# Patient Record
Sex: Male | Born: 1960 | Race: Black or African American | Hispanic: No | Marital: Married | Smoking: Never smoker
Health system: Southern US, Community
[De-identification: ages and names within clinical notes are randomized; demographics above are authoritative.]

## PROBLEM LIST (undated history)

## (undated) DIAGNOSIS — M1990 Unspecified osteoarthritis, unspecified site: Secondary | ICD-10-CM

## (undated) DIAGNOSIS — Z87442 Personal history of urinary calculi: Secondary | ICD-10-CM

## (undated) DIAGNOSIS — Z9189 Other specified personal risk factors, not elsewhere classified: Secondary | ICD-10-CM

## (undated) DIAGNOSIS — G43909 Migraine, unspecified, not intractable, without status migrainosus: Secondary | ICD-10-CM

## (undated) DIAGNOSIS — M545 Low back pain: Secondary | ICD-10-CM

## (undated) HISTORY — DX: Unspecified osteoarthritis, unspecified site: M19.90

## (undated) HISTORY — DX: Low back pain: M54.5

## (undated) HISTORY — DX: Migraine, unspecified, not intractable, without status migrainosus: G43.909

## (undated) HISTORY — DX: Personal history of urinary calculi: Z87.442

## (undated) HISTORY — DX: Other specified personal risk factors, not elsewhere classified: Z91.89

---

## 1998-05-30 DIAGNOSIS — Z87442 Personal history of urinary calculi: Secondary | ICD-10-CM

## 1998-05-30 HISTORY — DX: Personal history of urinary calculi: Z87.442

## 1998-08-16 ENCOUNTER — Emergency Department (HOSPITAL_COMMUNITY): Admission: EM | Admit: 1998-08-16 | Discharge: 1998-08-16 | Payer: Self-pay | Admitting: Emergency Medicine

## 2001-10-23 ENCOUNTER — Emergency Department (HOSPITAL_COMMUNITY): Admission: EM | Admit: 2001-10-23 | Discharge: 2001-10-23 | Payer: Self-pay | Admitting: Emergency Medicine

## 2003-08-27 ENCOUNTER — Encounter: Admission: RE | Admit: 2003-08-27 | Discharge: 2003-08-27 | Payer: Self-pay | Admitting: Family Medicine

## 2006-07-17 ENCOUNTER — Emergency Department (HOSPITAL_COMMUNITY): Admission: EM | Admit: 2006-07-17 | Discharge: 2006-07-17 | Payer: Self-pay | Admitting: Emergency Medicine

## 2006-07-31 ENCOUNTER — Encounter: Admission: RE | Admit: 2006-07-31 | Discharge: 2006-07-31 | Payer: Self-pay | Admitting: Internal Medicine

## 2006-08-08 ENCOUNTER — Encounter: Payer: Self-pay | Admitting: Internal Medicine

## 2006-08-10 ENCOUNTER — Encounter: Payer: Self-pay | Admitting: Internal Medicine

## 2006-08-14 ENCOUNTER — Encounter: Payer: Self-pay | Admitting: Internal Medicine

## 2006-08-14 LAB — CONVERTED CEMR LAB
ALT: 12 units/L
AST: 16 units/L
BUN: 17 mg/dL
Basophils Relative: 0 %
Chloride: 103 meq/L
Creatinine, Ser: 1.14 mg/dL
Glucose, Bld: 88 mg/dL
HDL: 67 mg/dL
Hgb A1c MFr Bld: 5.4 %
Lymphocytes, automated: 39 %
RDW: 14.6 %
Total Bilirubin: 0.5 mg/dL

## 2006-08-24 ENCOUNTER — Encounter: Admission: RE | Admit: 2006-08-24 | Discharge: 2006-08-24 | Payer: Self-pay | Admitting: Orthopedic Surgery

## 2006-09-07 ENCOUNTER — Encounter: Admission: RE | Admit: 2006-09-07 | Discharge: 2006-09-07 | Payer: Self-pay | Admitting: Orthopedic Surgery

## 2007-03-20 ENCOUNTER — Encounter: Admission: RE | Admit: 2007-03-20 | Discharge: 2007-03-20 | Payer: Self-pay | Admitting: Orthopedic Surgery

## 2007-05-31 DIAGNOSIS — M545 Low back pain, unspecified: Secondary | ICD-10-CM

## 2007-05-31 HISTORY — DX: Low back pain, unspecified: M54.50

## 2007-08-14 ENCOUNTER — Encounter: Payer: Self-pay | Admitting: Internal Medicine

## 2007-08-14 LAB — CONVERTED CEMR LAB
ALT: 22 units/L
AST: 15 units/L
Basophils Relative: 0 %
CO2: 26 meq/L
Chloride: 102 meq/L
HCT: 44.5 %
Hemoglobin: 14 g/dL
Hgb A1c MFr Bld: 5.8 %
Monocytes Relative: 10 %
Potassium: 4.2 meq/L
Total Bilirubin: 0.4 mg/dL
Triglyceride fasting, serum: 579 mg/dL

## 2007-09-20 ENCOUNTER — Encounter: Payer: Self-pay | Admitting: Internal Medicine

## 2007-09-20 LAB — CONVERTED CEMR LAB
HDL: 56 mg/dL
LDL Cholesterol: 131 mg/dL
Triglyceride fasting, serum: 164 mg/dL

## 2009-12-02 ENCOUNTER — Ambulatory Visit: Payer: Self-pay | Admitting: Internal Medicine

## 2009-12-02 LAB — CONVERTED CEMR LAB
AST: 23 units/L (ref 0–37)
Bilirubin, Direct: 0.1 mg/dL (ref 0.0–0.3)
CO2: 26 meq/L (ref 19–32)
Cholesterol: 244 mg/dL — ABNORMAL HIGH (ref 0–200)
Creatinine, Ser: 1.1 mg/dL (ref 0.4–1.5)
Direct LDL: 136.1 mg/dL
Glucose, Bld: 87 mg/dL (ref 70–99)
Ketones, ur: NEGATIVE mg/dL
Lymphocytes Relative: 40.1 % (ref 12.0–46.0)
Lymphs Abs: 2.7 10*3/uL (ref 0.7–4.0)
MCHC: 34.4 g/dL (ref 30.0–36.0)
MCV: 87.3 fL (ref 78.0–100.0)
Monocytes Absolute: 0.7 10*3/uL (ref 0.1–1.0)
Nitrite: NEGATIVE
PSA: 0.28 ng/mL (ref 0.10–4.00)
RBC: 4.48 M/uL (ref 4.22–5.81)
RDW: 13.6 % (ref 11.5–14.6)
Sodium: 137 meq/L (ref 135–145)
Specific Gravity, Urine: <=1.005 (ref 1.000–1.030)
Total Bilirubin: 0.8 mg/dL (ref 0.3–1.2)
Total CHOL/HDL Ratio: 5
Total Protein, Urine: NEGATIVE mg/dL
Total Protein: 7.8 g/dL (ref 6.0–8.3)
VLDL: 47.6 mg/dL — ABNORMAL HIGH (ref 0.0–40.0)
WBC: 6.8 10*3/uL (ref 4.5–10.5)

## 2009-12-07 ENCOUNTER — Ambulatory Visit: Payer: Self-pay | Admitting: Internal Medicine

## 2009-12-07 DIAGNOSIS — M1990 Unspecified osteoarthritis, unspecified site: Secondary | ICD-10-CM | POA: Insufficient documentation

## 2009-12-07 DIAGNOSIS — G43909 Migraine, unspecified, not intractable, without status migrainosus: Secondary | ICD-10-CM | POA: Insufficient documentation

## 2009-12-07 DIAGNOSIS — Z9189 Other specified personal risk factors, not elsewhere classified: Secondary | ICD-10-CM | POA: Insufficient documentation

## 2009-12-07 DIAGNOSIS — Z87442 Personal history of urinary calculi: Secondary | ICD-10-CM

## 2009-12-17 ENCOUNTER — Encounter: Payer: Self-pay | Admitting: Internal Medicine

## 2010-06-20 ENCOUNTER — Encounter: Payer: Self-pay | Admitting: Orthopedic Surgery

## 2010-06-29 NOTE — Letter (Signed)
Summary: [facility]  [facility]   Imported By: Sherian Rein 12/22/2009 11:03:14  _____________________________________________________________________  External Attachment:    Type:   Image     Comment:   External Document

## 2010-06-29 NOTE — Assessment & Plan Note (Signed)
Summary: NPX-LB   Vital Signs:  Patient profile:   50 year old male Height:      70 inches (177.80 cm) Weight:      172.0 pounds (78.18 kg) BMI:     24.77 O2 Sat:      98 % on Room air Temp:     98.6 degrees F (37.00 degrees C) oral Pulse rate:   80 / minute BP sitting:   124 / 88  (left arm) Cuff size:   regular  Vitals Entered By: Orlan Leavens (December 07, 2009 3:17 PM)  O2 Flow:  Room air CC: New patient cpx Is Patient Diabetic? No Pain Assessment Patient in pain? no        Primary Care Provider:  Newt Lukes MD  CC:  New patient cpx.  History of Present Illness: new pt to me and our practice, here to est care - also patient is here today for annual physical. Patient feels well and has no complaints.   Preventive Screening-Counseling & Management  Alcohol-Tobacco     Alcohol drinks/day: <1     Alcohol Counseling: not indicated; use of alcohol is not excessive or problematic     Smoking Status: never     Tobacco Counseling: not indicated; no tobacco use  Caffeine-Diet-Exercise     Diet Counseling: not indicated; diet is assessed to be healthy     Does Patient Exercise: yes     Exercise Counseling: not indicated; exercise is adequate     Depression Counseling: not indicated; screening negative for depression  Safety-Violence-Falls     Seat Belt Counseling: not indicated; patient wears seat belts     Helmet Counseling: not indicated; patient wears helmet when riding bicycle/motocycle     Firearm Counseling: not indicated; uses recommended firearm safety measures     Smoke Detectors: yes     Violence in the Home: no risk noted     Fall Risk Counseling: not indicated; no significant falls noted  Clinical Review Panels:  Prevention   Last PSA:  0.28 (12/02/2009)  Immunizations   Last Tetanus Booster:  Historical pt states given in hosp (05/30/2005)  Lipid Management   Cholesterol:  244 (12/02/2009)   HDL (good cholesterol):  53.30  (12/02/2009)  CBC   WBC:  6.8 (12/02/2009)   RBC:  4.48 (12/02/2009)   Hgb:  13.4 (12/02/2009)   Hct:  39.1 (12/02/2009)   Platelets:  260.0 (12/02/2009)   MCV  87.3 (12/02/2009)   MCHC  34.4 (12/02/2009)   RDW  13.6 (12/02/2009)   PMN:  43.1 (12/02/2009)   Lymphs:  40.1 (12/02/2009)   Monos:  10.3 (12/02/2009)   Eosinophils:  5.9 (12/02/2009)   Basophil:  0.6 (12/02/2009)  Complete Metabolic Panel   Glucose:  87 (12/02/2009)   Sodium:  137 (12/02/2009)   Potassium:  4.8 (12/02/2009)   Chloride:  104 (12/02/2009)   CO2:  26 (12/02/2009)   BUN:  19 (12/02/2009)   Creatinine:  1.1 (12/02/2009)   Albumin:  4.6 (12/02/2009)   Total Protein:  7.8 (12/02/2009)   Calcium:  9.5 (12/02/2009)   Total Bili:  0.8 (12/02/2009)   Alk Phos:  37 (12/02/2009)   SGPT (ALT):  28 (12/02/2009)   SGOT (AST):  23 (12/02/2009)   Current Medications (verified): 1)  Mens Multivitamin Plus  Tabs (Multiple Vitamins-Minerals) .... Take 1 By Mouth Once Daily 2)  Glucosamine-Chondroitin  Caps (Glucosamine-Chondroit-Vit C-Mn) .... Take 2 Weekly  Allergies (verified): No Known Drug Allergies  Past  History:  Past Medical History: low back pain - hx right side ruptured disc - 2009 Osteoarthritis kidney stone - 2000  Past Surgical History: Denies surgical history  Family History: Family History Diabetes 1st degree relative (parent) Family History Hypertension (parent) Family History of Prostate CA 1st degree relative <50 (parent) mom expired age 62 - HTN, DM dad expired age 23 - prostate ca  Social History: Never Smoked occassional alcohol - married, lives with spouse, teenage son (football Armed forces technical officer at [employer]) and twin dtrs (10y) 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 I have reviewed all other systems and they were negative.   Physical Exam  General:  fit, alert, well-developed,  well-nourished, and cooperative to examination.    Head:  Normocephalic and atraumatic without obvious abnormalities. No apparent alopecia or balding. Eyes:  vision grossly intact; pupils equal, round and reactive to light.  conjunctiva and lids normal.    Ears:  normal pinnae bilaterally, without erythema, swelling, or tenderness to palpation. TMs clear, without effusion, or cerumen impaction. Hearing grossly normal bilaterally  Mouth:  teeth and gums in good repair; mucous membranes moist, without lesions or ulcers. oropharynx clear without exudate, no erythema.  Lungs:  normal respiratory effort, no intercostal retractions or use of accessory muscles; normal breath sounds bilaterally - no crackles and no wheezes.    Heart:  normal rate, regular rhythm, no murmur, and no rub. BLE without edema. normal DP pulses and normal cap refill in all 4 extremities    Abdomen:  soft, non-tender, normal bowel sounds, no distention; no masses and no appreciable hepatomegaly or splenomegaly.   Rectal:  defer Genitalia:  defer Prostate:  defer - normal PSA noted Msk:  No deformity or scoliosis noted of thoracic or lumbar spine.  no joint effusions or deformities Neurologic:  alert & oriented X3 and cranial nerves II-XII symetrically intact.  strength normal in all extremities, sensation intact to light touch, and gait normal. speech fluent without dysarthria or aphasia; follows commands with good comprehension.  Skin:  no rashes, vesicles, ulcers, or erythema. No nodules or irregularity to palpation.  Psych:  very pleasant, Oriented X3, memory intact for recent and remote, normally interactive, good eye contact, not anxious appearing, not depressed appearing, and not agitated.      Impression & Recommendations:  Problem # 1:  PREVENTIVE HEALTH CARE (ICD-V70.0) Patient has been counseled on age-appropriate routine health concerns for screening and prevention. These are reviewed and up-to-date. Immunizations are  up-to-date or declined. Labs and ECG reviewed - diffuse peaked T waves but no cardiopulm symptoms - no prior EKG per pt to compare - no further eval at this time - advised omega 3 for tx of dyslipidemia (primary prevention, no other ASD risk factors) Orders: EKG w/ Interpretation (93000)  Problem # 2:  OSTEOARTHRITIS (ICD-715.90) cont gluc+chon and tylenol as needed - no acute flares  Complete Medication List: 1)  Mens Multivitamin Plus Tabs (Multiple vitamins-minerals) .... Take 1 by mouth once daily 2)  Glucosamine-chondroitin Caps (Glucosamine-chondroit-vit c-mn) .... Take 2 weekly 3)  Fish Oil 1000 Mg Caps (Omega-3 fatty acids) .Marland Kitchen.. 1 by mouth two times a day  Patient Instructions: 1)  it was good to see you today.  2)  exam and labs look good - keep active like you are doing! 3)  add fish oil for your cholesterol - no other  medicine changes 4)  Please schedule a follow-up appointment annually for your medical physical and labs, call sooner if problems.    Immunization History:  Tetanus/Td Immunization History:    Tetanus/Td:  historical pt states given in hosp (05/30/2005)

## 2010-06-29 NOTE — Letter (Signed)
Summary: [facility]  [facility]   Imported By: Sherian Rein 12/22/2009 11:02:16  _____________________________________________________________________  External Attachment:    Type:   Image     Comment:   External Document

## 2010-11-18 ENCOUNTER — Encounter: Payer: Self-pay | Admitting: Internal Medicine

## 2010-11-19 ENCOUNTER — Other Ambulatory Visit (INDEPENDENT_AMBULATORY_CARE_PROVIDER_SITE_OTHER): Payer: 59

## 2010-11-19 ENCOUNTER — Encounter: Payer: Self-pay | Admitting: Internal Medicine

## 2010-11-19 ENCOUNTER — Encounter: Payer: Self-pay | Admitting: *Deleted

## 2010-11-19 ENCOUNTER — Ambulatory Visit (INDEPENDENT_AMBULATORY_CARE_PROVIDER_SITE_OTHER): Payer: 59 | Admitting: Internal Medicine

## 2010-11-19 ENCOUNTER — Ambulatory Visit (INDEPENDENT_AMBULATORY_CARE_PROVIDER_SITE_OTHER)
Admission: RE | Admit: 2010-11-19 | Discharge: 2010-11-19 | Disposition: A | Discharge status: Home / Self Care | Payer: 59 | Source: Ambulatory Visit | Attending: Internal Medicine | Admitting: Internal Medicine

## 2010-11-19 VITALS — BP 132/88 | HR 67 | Temp 98.8°F | Ht 70.0 in | Wt 172.4 lb

## 2010-11-19 DIAGNOSIS — R109 Unspecified abdominal pain: Secondary | ICD-10-CM

## 2010-11-19 DIAGNOSIS — R1031 Right lower quadrant pain: Secondary | ICD-10-CM

## 2010-11-19 DIAGNOSIS — Z0000 Encounter for general adult medical examination without abnormal findings: Secondary | ICD-10-CM

## 2010-11-19 LAB — LIPID PANEL
LDL Cholesterol: 108 mg/dL — ABNORMAL HIGH (ref 0–99)
Total CHOL/HDL Ratio: 3
Triglycerides: 123 mg/dL (ref 0.0–149.0)
VLDL: 24.6 mg/dL (ref 0.0–40.0)

## 2010-11-19 LAB — CBC WITH DIFFERENTIAL/PLATELET
Eosinophils Absolute: 0.3 10*3/uL (ref 0.0–0.7)
HCT: 42.1 % (ref 39.0–52.0)
Lymphocytes Relative: 29.1 % (ref 12.0–46.0)
MCV: 87.8 fl (ref 78.0–100.0)
Monocytes Relative: 8.4 % (ref 3.0–12.0)
Neutro Abs: 4 10*3/uL (ref 1.4–7.7)
RDW: 14.2 % (ref 11.5–14.6)

## 2010-11-19 LAB — BASIC METABOLIC PANEL
BUN: 13 mg/dL (ref 6–23)
Calcium: 9.3 mg/dL (ref 8.4–10.5)
Chloride: 104 mEq/L (ref 96–112)

## 2010-11-19 LAB — URINALYSIS
Leukocytes, UA: NEGATIVE
Total Protein, Urine: NEGATIVE
Urobilinogen, UA: 0.2 (ref 0.0–1.0)
pH: 6 (ref 5.0–8.0)

## 2010-11-19 LAB — HEPATIC FUNCTION PANEL
Albumin: 4.7 g/dL (ref 3.5–5.2)
Alkaline Phosphatase: 33 U/L — ABNORMAL LOW (ref 39–117)
Total Bilirubin: 0.9 mg/dL (ref 0.3–1.2)

## 2010-11-19 MED ORDER — HYDROCODONE-ACETAMINOPHEN 5-500 MG PO TABS: 1.0000 | ORAL_TABLET | ORAL | Status: DC | PRN

## 2010-11-19 NOTE — Progress Notes (Signed)
  Subjective:    Patient ID: Jim Jones, male    DOB: 1960/06/06, 50 y.o.   MRN: [id number]  HPI  complains of abd pain Similar to kidney stone pain (2000) Onset 2 days ago Located RLQ pain and R flank pain - radiates to groin Onset while climbing onto semitruck (taller than usual) No hematuria Relieved with aleve and with "balling up legs to lie down" No weakness, no fever  Past Medical History  Diagnosis Date  . OA (osteoarthritis)   . Low back pain     Hx right side ruptured disc  . CHICKENPOX, HX OF   . MIGRAINE HEADACHE   . RENAL CALCULUS, HX OF     Review of Systems  Cardiovascular: Negative for chest pain.  Gastrointestinal: Negative for nausea, vomiting and diarrhea.       Objective:   Physical Exam BP 132/88  Pulse 67  Temp(Src) 98.8 F (37.1 C) (Oral)  Ht 5\' 10"  (1.778 m)  Wt 172 lb 6.4 oz (78.2 kg)  BMI 24.74 kg/m2  SpO2 98%  Physical Exam  Constitutional:  oriented to person, place, and time. appears well-developed and well-nourished. No distress.  Neck: Normal range of motion. Neck supple. No JVD present. No thyromegaly present.  Cardiovascular: Normal rate, regular rhythm and normal heart sounds.  No murmur heard. Pulmonary/Chest: Effort normal and breath sounds normal. No respiratory distress. no wheezes.  Abdominal: Soft. Bowel sounds are normal. Patient exhibits no distension. There is no tenderness.  Skin: Skin is warm and dry.  No erythema or ulceration. no shingles Psychiatric: he has a normal mood and affect. behavior is normal. Judgment and thought content normal.   Lab Results  Component Value Date   WBC 6.8 12/02/2009   HGB 13.4 12/02/2009   HCT 39.1 12/02/2009   PLT 260.0 12/02/2009   CHOL 244* 12/02/2009   TRIG 238.0* 12/02/2009   HDL 53.30 12/02/2009   LDLDIRECT 136.1 12/02/2009   ALT 28 12/02/2009   AST 23 12/02/2009   NA 137 12/02/2009   K 4.8 12/02/2009   CL 104 12/02/2009   CREATININE 1.1 12/02/2009   BUN 19 12/02/2009   CO2 26 12/02/2009   TSH  2.21 12/02/2009   PSA 0.28 12/02/2009   HGBA1C 5.8 08/14/2007        Assessment & Plan:  RLQ and R flank pain - similar to prior kidney stone (passed spont in 2000) - check UA and cbc/bmet, also arrange CT - cont aleve and few hydrocodone if unrelieved pain after NSAIDs - work note for today and yest provided

## 2010-11-19 NOTE — Patient Instructions (Signed)
It was good to see you today. Test(s) ordered today. Your results will be called to you after review (48-72hours after test completion). If any changes need to be made, you will be notified at that time. we'll make referral for CT scan to look for kidney stones. Our office will contact you regarding appointment(s) once made. Continue Aleve for pain and use Vicodin if needed for pain not controlled with Aleve Work note provided as requested

## 2010-12-07 ENCOUNTER — Other Ambulatory Visit: Payer: Self-pay | Admitting: Internal Medicine

## 2010-12-07 ENCOUNTER — Other Ambulatory Visit (INDEPENDENT_AMBULATORY_CARE_PROVIDER_SITE_OTHER): Payer: 59

## 2010-12-07 DIAGNOSIS — Z0000 Encounter for general adult medical examination without abnormal findings: Secondary | ICD-10-CM

## 2010-12-07 LAB — CBC WITH DIFFERENTIAL/PLATELET
Eosinophils Absolute: 0.3 10*3/uL (ref 0.0–0.7)
Hemoglobin: 13.4 g/dL (ref 13.0–17.0)
MCHC: 33.9 g/dL (ref 30.0–36.0)
Platelets: 284 10*3/uL (ref 150.0–400.0)
RBC: 4.5 Mil/uL (ref 4.22–5.81)
WBC: 5.9 10*3/uL (ref 4.5–10.5)

## 2010-12-07 LAB — URINALYSIS
Leukocytes, UA: NEGATIVE
Nitrite: NEGATIVE
Urine Glucose: NEGATIVE
Urobilinogen, UA: 0.2 (ref 0.0–1.0)
pH: 6 (ref 5.0–8.0)

## 2010-12-07 LAB — BASIC METABOLIC PANEL
CO2: 26 mEq/L (ref 19–32)
Chloride: 106 mEq/L (ref 96–112)
Creatinine, Ser: 1.1 mg/dL (ref 0.4–1.5)
Potassium: 4.2 mEq/L (ref 3.5–5.1)
Sodium: 140 mEq/L (ref 135–145)

## 2010-12-14 ENCOUNTER — Encounter: Payer: Self-pay | Admitting: Internal Medicine

## 2010-12-14 ENCOUNTER — Ambulatory Visit (INDEPENDENT_AMBULATORY_CARE_PROVIDER_SITE_OTHER): Payer: 59 | Admitting: Internal Medicine

## 2010-12-14 VITALS — BP 122/82 | HR 65 | Temp 97.7°F | Ht 70.0 in | Wt 175.8 lb

## 2010-12-14 DIAGNOSIS — R109 Unspecified abdominal pain: Secondary | ICD-10-CM

## 2010-12-14 DIAGNOSIS — R1031 Right lower quadrant pain: Secondary | ICD-10-CM

## 2010-12-14 DIAGNOSIS — Z0000 Encounter for general adult medical examination without abnormal findings: Secondary | ICD-10-CM

## 2010-12-14 MED ORDER — HYDROCODONE-ACETAMINOPHEN 5-500 MG PO TABS: 1.0000 | ORAL_TABLET | Freq: Three times a day (TID) | ORAL | Status: DC | PRN

## 2010-12-14 NOTE — Patient Instructions (Signed)
It was good to see you today. Exam, heart EKG and labs look great! Cholesterol has improved since last year! Keep up the good work taking care of your self - eat right and keep up exercise habits Limited refill on vicodin as discussed for bad days - if more needed, will need office visit to discuss the pain - Your prescription(s) have been submitted to your pharmacy. Please take as directed and contact our office if you believe you are having problem(s) with the medication(s). Please schedule followup in 1 year for physical and labs, call sooner if problems.

## 2010-12-14 NOTE — Progress Notes (Signed)
Subjective:    Patient ID: Jim Jones, male    DOB: 1960/06/04, 50 y.o.   MRN: [id number]  HPI patient is here today for annual physical. Patient feels well and has no complaints. Improved exercise since last year - now running and gyn 2-3x/week  Past Medical History  Diagnosis Date  . OA (osteoarthritis)   . Low back pain 2009    Hx right side ruptured disc  . CHICKENPOX, HX OF   . MIGRAINE HEADACHE   . RENAL CALCULUS, HX OF 2000   Family History  Problem Relation Age of Onset  . Diabetes Mother   . Hypertension Mother   . Prostate cancer Father    History  Substance Use Topics  . Smoking status: Never Smoker   . Smokeless tobacco: Not on file   Comment: Married, lives with spouse, teenage son (football Armed forces technical officer at [employer]) and twins dtrs 48yo. 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 Alcohol Use: Yes     Occassional   Review of Systems Constitutional: Negative for fever.  Respiratory: Negative for cough and shortness of breath.   Cardiovascular: Negative for chest pain.  Gastrointestinal: Negative for abdominal pain.  Musculoskeletal: Negative for gait problem.  Skin: Negative for rash.  Neurological: Negative for dizziness.  No other specific complaints in a complete review of systems (except as listed in HPI above).     Objective:   Physical Exam BP 122/82  Pulse 65  Temp(Src) 97.7 F (36.5 C) (Oral)  Ht 5\' 10"  (1.778 m)  Wt 175 lb 12.8 oz (79.742 kg)  BMI 25.22 kg/m2  SpO2 99% Wt Readings from Last 3 Encounters:  12/14/10 175 lb 12.8 oz (79.742 kg)  11/19/10 172 lb 6.4 oz (78.2 kg)  12/07/09 172 lb (78.019 kg)    Physical Exam  Constitutional:  oriented to person, place, and time. appears well-developed and well-nourished. No distress.  Neck: Normal range of motion. Neck supple. No JVD present. No thyromegaly present.  Cardiovascular: Normal rate, regular rhythm and normal heart sounds.  No murmur heard. Pulmonary/Chest: Effort normal  and breath sounds normal. No respiratory distress. no wheezes.  Abdominal: Soft. Bowel sounds are normal. Patient exhibits no distension. There is no tenderness.  Musculoskeletal: Normal range of motion. Back: full range of motion of thoracic and lumbar spine. Non tender to palpation. Negative straight leg raise. DTR's are symmetrically intact. Sensation intact in all dermatomes of the lower extremities. Full strength to manual muscle testing including the EHL, anterior tibialis, gastrocnemius, quadricepts, and iliopsoas. the patient is able to heel toe walk without difficulty and ambulates with a normal gait. GU: defer Neurological: he is alert and oriented to person, place, and time. No cranial nerve deficit. Coordination normal.  Skin: Skin is warm and dry.  No erythema or ulceration.  Psychiatric: he has a normal mood and affect. behavior is normal. Judgment and thought content normal.   Lab Results  Component Value Date   WBC 5.9 12/07/2010   HGB 13.4 12/07/2010   HCT 39.4 12/07/2010   PLT 284.0 12/07/2010   CHOL 198 11/19/2010   TRIG 123.0 11/19/2010   HDL 65.00 11/19/2010   LDLDIRECT 136.1 12/02/2009   ALT 17 11/19/2010   AST 18 11/19/2010   NA 140 12/07/2010   K 4.2 12/07/2010   CL 106 12/07/2010   CREATININE 1.1 12/07/2010   BUN 14 12/07/2010   CO2 26 12/07/2010   TSH 2.65 11/19/2010   PSA 0.28 12/02/2009   HGBA1C  5.8 08/14/2007    EKG - sinus at 65bpm - chronic ant lat elev ST unchanged from 12/07/09 CPX ecg     Assessment & Plan:  CPX - v70.0 - Patient has been counseled on age-appropriate routine health concerns for screening and prevention. These are reviewed and up-to-date. Immunizations are up-to-date or declined. Labs and ECG reviewed.  Limited # vicodin given for "bad back days" to use sparingly - pt understands if he calls for refills, will need eval for cause of worsening pain

## 2011-02-21 ENCOUNTER — Ambulatory Visit: Payer: 59 | Admitting: Internal Medicine

## 2011-02-21 DIAGNOSIS — Z0289 Encounter for other administrative examinations: Secondary | ICD-10-CM

## 2011-09-13 ENCOUNTER — Ambulatory Visit (INDEPENDENT_AMBULATORY_CARE_PROVIDER_SITE_OTHER)
Admission: RE | Admit: 2011-09-13 | Discharge: 2011-09-13 | Disposition: A | Discharge status: Home / Self Care | Payer: Worker's Compensation | Source: Ambulatory Visit | Attending: Internal Medicine | Admitting: Internal Medicine

## 2011-09-13 ENCOUNTER — Encounter: Payer: Self-pay | Admitting: Internal Medicine

## 2011-09-13 ENCOUNTER — Ambulatory Visit (INDEPENDENT_AMBULATORY_CARE_PROVIDER_SITE_OTHER): Payer: Worker's Compensation | Admitting: Internal Medicine

## 2011-09-13 VITALS — BP 138/92 | HR 68 | Temp 97.8°F | Ht 70.0 in | Wt 175.4 lb

## 2011-09-13 DIAGNOSIS — M25529 Pain in unspecified elbow: Secondary | ICD-10-CM

## 2011-09-13 DIAGNOSIS — M7021 Olecranon bursitis, right elbow: Secondary | ICD-10-CM

## 2011-09-13 DIAGNOSIS — M7020 Olecranon bursitis, unspecified elbow: Secondary | ICD-10-CM

## 2011-09-13 DIAGNOSIS — M25521 Pain in right elbow: Secondary | ICD-10-CM

## 2011-09-13 MED ORDER — NAPROXEN SODIUM 220 MG PO TABS: 220.0000 mg | ORAL_TABLET | Freq: Two times a day (BID) | ORAL | Status: DC

## 2011-09-13 MED ORDER — HYDROCODONE-ACETAMINOPHEN 5-500 MG PO TABS: 1.0000 | ORAL_TABLET | Freq: Three times a day (TID) | ORAL | Status: DC | PRN

## 2011-09-13 NOTE — Progress Notes (Signed)
  Subjective:    Patient ID: Jim Jones, male    DOB: 09-13-1960, 51 y.o.   MRN: [id number]  HPI  Complains of injury to right elbow Onset last night Precipitated by direct trauma of elbow  Using Aleve without relief of symptoms overnight Also ice without change or improvement in symptoms Progressive swelling over elbow tip overnight Pain with touch - also flexion>extention causes pain  Past Medical History  Diagnosis Date  . OA (osteoarthritis)   . Low back pain 2009    Hx right side ruptured disc  . CHICKENPOX, HX OF   . MIGRAINE HEADACHE   . RENAL CALCULUS, HX OF 2000     Review of Systems  Constitutional: Negative for fever and chills.  Musculoskeletal: Positive for back pain (chronic). Negative for myalgias.  Neurological: Negative for seizures, weakness and headaches.       Objective:   Physical Exam BP 138/92  Pulse 68  Temp(Src) 97.8 F (36.6 C) (Oral)  Ht 5\' 10"  (1.778 m)  Wt 175 lb 6.4 oz (79.561 kg)  BMI 25.17 kg/m2  SpO2 98% Constitutional:  He appears well-developed and well-nourished. No distress.   Musculoskeletal: Limited olecranon swelling of right elbow. Tender to touch over tip of olecranon. Epicondyles without swelling or tenderness. Full range of motion. Neurovascular intact distally Skin: Skin is warm and dry.  No erythema or ulceration.  Psychiatric: he has a normal mood and affect. behavior is normal. Judgment and thought content normal.        Assessment & Plan:  olecranon bursitis right elbow, traumatic  Check xray r/o fracture Ice, NSAIDs and hydrocodone prn severe pain Care instructions provided on same

## 2011-09-13 NOTE — Patient Instructions (Addendum)
It was good to see you today. Test(s) ordered today. Your results will be called to you after review (48-72hours after test completion). If any changes need to be made, you will be notified at that time. Use aleve 2x/day x 1 week then as needed - Also ok to use Vicodin as needed for severe pain symptoms - Your prescription(s) have been submitted to your pharmacy. Please take as directed and contact our office if you believe you are having problem(s) with the medication(s). Elbow Injury Minor fractures, sprains, and bruises of the elbow will all cause swelling and pain. X-rays often show swelling around the joint but may not show a fracture line on x-rays taken right after the injury. The treatment for all these types of injuries is to reduce swelling and pain and to rest the joint until movement is painless. Repeat exam and x-rays several weeks after an elbow injury may show a minor fracture not seen on the initial exam. Apply ice packs to the elbow for 20-30 minutes every 2 hours for the next few days. Keep your elbow elevated above the level of your heart as much as possible until the pain and swelling are better. Call your caregiver for follow-up care within one week. Keeping the elbow immobilized for too long can hurt recovery.   SEEK MEDICAL CARE IF:    Your pain increases, or if you develop a numb, cold, or pale forearm or hand.   You are not improving.   You have any other questions or concerns regarding your injury.  Document Released: 06/23/2004 Document Revised: 05/05/2011 Document Reviewed: 06/04/2008 [facility] Patient Information 2012 [place], [place].

## 2011-09-23 ENCOUNTER — Ambulatory Visit
Admission: RE | Admit: 2011-09-23 | Discharge: 2011-09-23 | Disposition: A | Discharge status: Home / Self Care | Payer: 59 | Source: Ambulatory Visit | Attending: Orthopedic Surgery | Admitting: Orthopedic Surgery

## 2011-09-23 ENCOUNTER — Other Ambulatory Visit: Payer: Self-pay | Admitting: Orthopedic Surgery

## 2011-09-23 DIAGNOSIS — R52 Pain, unspecified: Secondary | ICD-10-CM

## 2011-09-28 HISTORY — PX: OLECRANON BURSA EXCISION: SUR541

## 2011-12-08 ENCOUNTER — Other Ambulatory Visit: Payer: 59

## 2011-12-09 ENCOUNTER — Other Ambulatory Visit (INDEPENDENT_AMBULATORY_CARE_PROVIDER_SITE_OTHER): Payer: 59

## 2011-12-09 ENCOUNTER — Telehealth: Payer: Self-pay | Admitting: *Deleted

## 2011-12-09 DIAGNOSIS — Z0000 Encounter for general adult medical examination without abnormal findings: Secondary | ICD-10-CM

## 2011-12-09 DIAGNOSIS — Z125 Encounter for screening for malignant neoplasm of prostate: Secondary | ICD-10-CM

## 2011-12-09 LAB — CBC WITH DIFFERENTIAL/PLATELET
Eosinophils Relative: 1.5 % (ref 0.0–5.0)
HCT: 41.6 % (ref 39.0–52.0)
Hemoglobin: 13.7 g/dL (ref 13.0–17.0)
Lymphs Abs: 2.8 10*3/uL (ref 0.7–4.0)
Monocytes Relative: 10.3 % (ref 3.0–12.0)
Neutro Abs: 3.4 10*3/uL (ref 1.4–7.7)
WBC: 7.1 10*3/uL (ref 4.5–10.5)

## 2011-12-09 LAB — URINALYSIS, ROUTINE W REFLEX MICROSCOPIC
Specific Gravity, Urine: >=1.03 (ref 1.000–1.030)
Total Protein, Urine: NEGATIVE
Urine Glucose: NEGATIVE

## 2011-12-09 LAB — BASIC METABOLIC PANEL
CO2: 28 mEq/L (ref 19–32)
Calcium: 10 mg/dL (ref 8.4–10.5)
Glucose, Bld: 101 mg/dL — ABNORMAL HIGH (ref 70–99)
Sodium: 137 mEq/L (ref 135–145)

## 2011-12-09 LAB — HEPATIC FUNCTION PANEL: Albumin: 4.4 g/dL (ref 3.5–5.2)

## 2011-12-09 LAB — PSA: PSA: 0.29 ng/mL (ref 0.10–4.00)

## 2011-12-09 LAB — LIPID PANEL: Triglycerides: 338 mg/dL — ABNORMAL HIGH (ref 0.0–149.0)

## 2011-12-09 NOTE — Telephone Encounter (Signed)
Pt is in lab needing cpx labs. No order in comp. Inform anita will put cpx labs in epic... 12/09/11@11 :51am/LMB

## 2011-12-15 ENCOUNTER — Encounter: Payer: Self-pay | Admitting: Internal Medicine

## 2011-12-15 ENCOUNTER — Ambulatory Visit (INDEPENDENT_AMBULATORY_CARE_PROVIDER_SITE_OTHER): Payer: 59 | Admitting: Internal Medicine

## 2011-12-15 VITALS — BP 118/72 | HR 81 | Temp 98.4°F | Ht 70.0 in | Wt 169.1 lb

## 2011-12-15 DIAGNOSIS — Z0000 Encounter for general adult medical examination without abnormal findings: Secondary | ICD-10-CM

## 2011-12-15 DIAGNOSIS — Z136 Encounter for screening for cardiovascular disorders: Secondary | ICD-10-CM

## 2011-12-15 MED ORDER — HYDROCODONE-ACETAMINOPHEN 5-500 MG PO TABS: 1.0000 | ORAL_TABLET | Freq: Three times a day (TID) | ORAL | Status: AC | PRN

## 2011-12-15 NOTE — Progress Notes (Signed)
Subjective:    Patient ID: Jim Jones, male    DOB: 26-Jan-1961, 51 y.o.   MRN: [id number]  HPI  patient is here today for annual physical. Patient feels well and has no complaints. Improved exercise in past year - now running and gym workouts 2-3x/week  Past Medical History  Diagnosis Date  . OA (osteoarthritis)   . Low back pain 2009    Hx right side ruptured disc  . CHICKENPOX, HX OF   . MIGRAINE HEADACHE   . RENAL CALCULUS, HX OF 2000   Family History  Problem Relation Age of Onset  . Diabetes Mother   . Hypertension Mother   . Prostate cancer Father    History  Substance Use Topics  . Smoking status: Never Smoker   . Smokeless tobacco: Not on file   Comment: Married, lives with spouse, teenage son (football Armed forces technical officer at [employer]) and twins dtrs. 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 Alcohol Use: Yes     Occassional   Review of Systems  Constitutional: Negative for fever or unexpected weight gain.  Respiratory: Negative for cough and shortness of breath.   Cardiovascular: Negative for chest pain.  Gastrointestinal: Negative for abdominal pain.  Musculoskeletal: Negative for gait problem.  Skin: Negative for rash.  Neurological: Negative for dizziness.  No other specific complaints in a complete review of systems (except as listed in HPI above).     Objective:   Physical Exam  BP 118/72  Pulse 81  Temp 98.4 F (36.9 C) (Oral)  Ht 5\' 10"  (1.778 m)  Wt 169 lb 1.9 oz (76.712 kg)  BMI 24.27 kg/m2  SpO2 97% Wt Readings from Last 3 Encounters:  12/15/11 169 lb 1.9 oz (76.712 kg)  09/13/11 175 lb 6.4 oz (79.561 kg)  12/14/10 175 lb 12.8 oz (79.742 kg)   Constitutional: he appears well-developed and well-nourished. No distress.  Neck: Normal range of motion. Neck supple. No JVD present. No thyromegaly present.  Cardiovascular: Normal rate, regular rhythm and normal heart sounds.  No murmur heard. no BLE edema Pulmonary/Chest: Effort normal and  breath sounds normal. No respiratory distress. no wheezes.  Abdominal: Soft. Bowel sounds are normal. Patient exhibits no distension. There is no tenderness.  Musculoskeletal: Normal range of motion. Back: full range of motion of thoracic and lumbar spine. Non tender to palpation. Negative straight leg raise. DTR's are symmetrically intact. Sensation intact in all dermatomes of the lower extremities. Full strength to manual muscle testing including the EHL, anterior tibialis, gastrocnemius, quadricepts, and iliopsoas. the patient is able to heel toe walk without difficulty and ambulates with a normal gait.  R elbow with good ROM, min swelling - inscion well healed GU: defer Neurological: he is alert and oriented to person, place, and time. No cranial nerve deficit. Coordination normal.  Skin: Skin is warm and dry.  No erythema or ulceration.  Psychiatric: he has a normal mood and affect. behavior is normal. Judgment and thought content normal.   Lab Results  Component Value Date   WBC 7.1 12/09/2011   HGB 13.7 12/09/2011   HCT 41.6 12/09/2011   PLT 244.0 12/09/2011   CHOL 266* 12/09/2011   TRIG 338.0* 12/09/2011   HDL 61.40 12/09/2011   LDLDIRECT 102.9 12/09/2011   ALT 29 12/09/2011   AST 20 12/09/2011   NA 137 12/09/2011   K 4.7 12/09/2011   CL 102 12/09/2011   CREATININE 1.3 12/09/2011   BUN 16 12/09/2011   CO2 28  12/09/2011   TSH 2.25 12/09/2011   PSA 0.29 12/09/2011   HGBA1C 5.8 08/14/2007    EKG - sinus at 74bpm - chronic ant lat ST elevation, unchanged from 12/14/10 CPX ecg     Assessment & Plan:  CPX - v70.0 - Patient has been counseled on age-appropriate routine health concerns for screening and prevention. These are reviewed and up-to-date. Immunizations are up-to-date or declined. Labs and ECG reviewed.  Limited # vicodin given for "bad back days" to use sparingly - pt understands if he calls for refills, will need eval for cause of worsening pain

## 2011-12-15 NOTE — Patient Instructions (Signed)
It was good to see you today. We have reviewed your prior records including labs and tests today Health Maintenance reviewed - all recommended immunizations and age-appropriate screenings are up-to-date. Medications reviewed, no changes at this time. Refill on medication(s) as discussed today. Try reading glasses - see the eye doctor if still having trouble with blurring while reading Please schedule followup in 1 year for physical and labs, call sooner if problems.  Health Maintenance, Males A healthy lifestyle and preventative care can promote health and wellness.  Maintain regular health, dental, and eye exams.   Eat a healthy diet. Foods like vegetables, fruits, whole grains, low-fat dairy products, and lean protein foods contain the nutrients you need without too many calories. Decrease your intake of foods high in solid fats, added sugars, and salt. Get information about a proper diet from your caregiver, if necessary.   Regular physical exercise is one of the most important things you can do for your health. Most adults should get at least 150 minutes of moderate-intensity exercise (any activity that increases your heart rate and causes you to sweat) each week. In addition, most adults need muscle-strengthening exercises on 2 or more days a week.     Maintain a healthy weight. The body mass index (BMI) is a screening tool to identify possible weight problems. It provides an estimate of body fat based on height and weight. Your caregiver can help determine your BMI, and can help you achieve or maintain a healthy weight. For adults 20 years and older:   A BMI below 18.5 is considered underweight.   A BMI of 18.5 to 24.9 is normal.   A BMI of 25 to 29.9 is considered overweight.   A BMI of 30 and above is considered obese.   Maintain normal blood lipids and cholesterol by exercising and minimizing your intake of saturated fat. Eat a balanced diet with plenty of fruits and vegetables.  Blood tests for lipids and cholesterol should begin at age 20 and be repeated every 5 years. If your lipid or cholesterol levels are high, you are over 50, or you are a high risk for heart disease, you may need your cholesterol levels checked more frequently. Ongoing high lipid and cholesterol levels should be treated with medicines, if diet and exercise are not effective.   If you smoke, find out from your caregiver how to quit. If you do not use tobacco, do not start.   If you choose to drink alcohol, do not exceed 2 drinks per day. One drink is considered to be 12 ounces (355 mL) of beer, 5 ounces (148 mL) of wine, or 1.5 ounces (44 mL) of liquor.   Avoid use of street drugs. Do not share needles with anyone. Ask for help if you need support or instructions about stopping the use of drugs.   High blood pressure causes heart disease and increases the risk of stroke. Blood pressure should be checked at least every 1 to 2 years. Ongoing high blood pressure should be treated with medicines if weight loss and exercise are not effective.   If you are 13 to 49 years old, ask your caregiver if you should take aspirin to prevent heart disease.   Diabetes screening involves taking a blood sample to check your fasting blood sugar level. This should be done once every 3 years, after age 31, if you are within normal weight and without risk factors for diabetes. Testing should be considered at a younger age or be  carried out more frequently if you are overweight and have at least 1 risk factor for diabetes.   Colorectal cancer can be detected and often prevented. Most routine colorectal cancer screening begins at the age of 51 and continues through age 56. However, your caregiver may recommend screening at an earlier age if you have risk factors for colon cancer. On a yearly basis, your caregiver may provide home test kits to check for hidden blood in the stool. Use of a small camera at the end of a tube, to  directly examine the colon (sigmoidoscopy or colonoscopy), can detect the earliest forms of colorectal cancer. Talk to your caregiver about this at age 42, when routine screening begins. Direct examination of the colon should be repeated every 5 to 10 years through age 56, unless early forms of pre-cancerous polyps or small growths are found.   Hepatitis C blood testing is recommended for all people born from [birth year] through 1965 and any individual with known risks for hepatitis C.   Healthy men should no longer receive prostate-specific antigen (PSA) blood tests as part of routine cancer screening. Consult with your caregiver about prostate cancer screening.   Testicular cancer screening is not recommended for adolescents or adult males who have no symptoms. Screening includes self-exam, caregiver exam, and other screening tests. Consult with your caregiver about any symptoms you have or any concerns you have about testicular cancer.   Practice safe sex. Use condoms and avoid high-risk sexual practices to reduce the spread of sexually transmitted infections (STIs).   Use sunscreen with a sun protection factor (SPF) of 30 or greater. Apply sunscreen liberally and repeatedly throughout the day. You should seek shade when your shadow is shorter than you. Protect yourself by wearing long sleeves, pants, a wide-brimmed hat, and sunglasses year round, whenever you are outdoors.   Notify your caregiver of new moles or changes in moles, especially if there is a change in shape or color. Also notify your caregiver if a mole is larger than the size of a pencil eraser.   A one-time screening for abdominal aortic aneurysm (AAA) and surgical repair of large AAAs by sound wave imaging (ultrasonography) is recommended for ages 93 to 45 years who are current or former smokers.   Stay current with your immunizations.  Document Released: 11/12/2007 Document Revised: 05/05/2011 Document Reviewed: 10/11/2010 [facility]  Patient Information 2012 [place], [place].

## 2012-10-01 ENCOUNTER — Ambulatory Visit (INDEPENDENT_AMBULATORY_CARE_PROVIDER_SITE_OTHER): Payer: 59 | Admitting: Family Medicine

## 2012-10-01 VITALS — BP 130/84 | HR 72 | Temp 97.9°F | Resp 16 | Ht 68.0 in | Wt 171.0 lb

## 2012-10-01 DIAGNOSIS — S61409A Unspecified open wound of unspecified hand, initial encounter: Secondary | ICD-10-CM

## 2012-10-01 DIAGNOSIS — M79609 Pain in unspecified limb: Secondary | ICD-10-CM

## 2012-10-01 DIAGNOSIS — S61412A Laceration without foreign body of left hand, initial encounter: Secondary | ICD-10-CM

## 2012-10-01 DIAGNOSIS — Z23 Encounter for immunization: Secondary | ICD-10-CM

## 2012-10-01 MED ORDER — MELOXICAM 7.5 MG PO TABS: 7.5000 mg | ORAL_TABLET | Freq: Every day | ORAL | Status: DC

## 2012-10-01 NOTE — Patient Instructions (Addendum)

## 2012-10-01 NOTE — Progress Notes (Signed)
[facility] [address] [place] [address] [phone]  Date:  10/01/2012   Name:  Jim Jones   DOB:  06-Apr-1961   MRN:  [id number]  PCP:  Rene Paci, MD    Chief Complaint: laceration on LT hand   History of Present Illness:  Jim Jones is a 52 y.o. very pleasant male patient who presents with the following:  He was working outside yesterday- planting shrubs. There was broken glass mixed in the dirt in the area where he was working and he cut his left hand through a glove.   This occurred about 6:30 pm yesterday.   He is right handed.  The wound is on his left hand at the base of the pinky finger.   He is otherwise generally healthy, he only rarely gets migraine HA.  His last tetanus was a Td in 2007- he does not think he has had a Tdap as of yet.    Patient Active Problem List   Diagnosis Date Noted  . MIGRAINE HEADACHE 12/07/2009  . OSTEOARTHRITIS 12/07/2009  . RENAL CALCULUS, HX OF 12/07/2009    Past Medical History  Diagnosis Date  . OA (osteoarthritis)   . Low back pain 2009    Hx right side ruptured disc  . CHICKENPOX, HX OF   . MIGRAINE HEADACHE   . RENAL CALCULUS, HX OF 2000    Past Surgical History  Procedure Laterality Date  . Olecranon bursa excision  09/28/11    right side, [place]    History  Substance Use Topics  . Smoking status: Never Smoker   . Smokeless tobacco: Not on file     Comment: Married, lives with spouse, teenage son (football Armed forces technical officer at [place]) and twins dtrs. 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 Alcohol Use: Yes     Comment: Occassional    Family History  Problem Relation Age of Onset  . Diabetes Mother   . Hypertension Mother   . Prostate cancer Father     No Known Allergies  Medication list has been reviewed and updated.  Current Outpatient Prescriptions on File Prior to Visit  Medication Sig Dispense Refill  . Multiple Vitamins-Minerals (MENS MULTIVITAMIN PLUS)  TABS Take by mouth daily.        . Glucosamine-Chondroitin (GLUCOSAMINE CHONDR COMPLEX PO) Take by mouth 2 (two) times a week.        Mar[place] Kitchen HYDROcodone-acetaminophen (VICODIN) 5-500 MG per tablet Take 1 tablet by mouth every 8 (eight) hours as needed for pain.  30 tablet  1  . naproxen sodium (ALEVE) 220 MG tablet Take 1 tablet (220 mg total) by mouth 2 (two) times daily with a meal. X 1 week, then as needed for pain      . Omega-3 Fatty Acids (FISH OIL) 1000 MG CAPS Take by mouth 2 (two) times daily.         No current facility-administered medications on file prior to visit.    Review of Systems:  As per HPI- otherwise negative.   Physical Examination: Filed Vitals:   10/01/12 1054  BP: 130/84  Pulse: 72  Temp: 97.9 F (36.6 C)  Resp: 16   Filed Vitals:   10/01/12 1054  Height: 5\' 8"  (1.727 m)  Weight: 171 lb (77.565 kg)   Body mass index is 26.01 kg/(m^2). Ideal Body Weight: Weight in (lb) to have BMI = 25: 164.1  GEN: WDWN, NAD, Non-toxic, A & O x 3,  looks well HEENT: Atraumatic, Normocephalic. Neck supple. No masses, No LAD. Ears and Nose: No external deformity. CV: RRR, No M/G/R. No JVD. No thrill. No extra heart sounds. PULM: CTA B, no wheezes, crackles, rhonchi. No retractions. No resp. distress. No accessory muscle use. EXTR: No c/c/e NEURO Normal gait.  PSYCH: Normally interactive. Conversant. Not depressed or anxious appearing.  Calm demeanor.  Left hand- there is a straightt, clean wound at the base of the left pinky finger, over the MCP joint. It is over the palmar and lateral side of the finger.  Finger with good cap refill, normal sensation and normal flexion/ extension/ radial and ulnar deviation strength.   Please see procedure note per Rhoderick Moody, PA-C.    Assessment and Plan: Laceration of hand, left, initial encounter - Plan: Tdap vaccine greater than or equal to 7yo IM  Pain, hand, left - Plan: meloxicam (MOBIC) 7.5 MG tablet  Laceration repaired as  per Ms. Marte.  Wound care instructions.  Placed in a splint for protection prn.  Work note mobic as needed for pain  Signed Abbe Amsterdam, MD

## 2012-10-01 NOTE — Progress Notes (Signed)
Patient ID: CHRISTINO MCGLINCHEY MRN: [id number], DOB: 12-26-1960, 52 y.o. Date of Encounter: 10/01/2012, 12:10 PM   PROCEDURE NOTE: Verbal consent obtained. Sterile technique employed. Numbing: Anesthesia obtained with 2% lidocaine plain  Cleansed with soap and water. Irrigated.  Wound explored, no deep structures involved, no foreign bodies.   Wound repaired with # 7 SI sutures using 5-0 ethilon Hemostasis obtained. Wound cleansed and dressed.  Wound care instructions including precautions covered with patient. Handout given.  Anticipate suture removal in 7-10 days  Rhoderick Moody, PA-C 10/01/2012 12:10 PM

## 2012-12-17 ENCOUNTER — Encounter: Payer: Self-pay | Admitting: Internal Medicine

## 2012-12-17 DIAGNOSIS — Z0289 Encounter for other administrative examinations: Secondary | ICD-10-CM

## 2013-03-01 ENCOUNTER — Other Ambulatory Visit (INDEPENDENT_AMBULATORY_CARE_PROVIDER_SITE_OTHER): Payer: 59

## 2013-03-01 ENCOUNTER — Telehealth: Payer: Self-pay | Admitting: *Deleted

## 2013-03-01 DIAGNOSIS — Z125 Encounter for screening for malignant neoplasm of prostate: Secondary | ICD-10-CM

## 2013-03-01 DIAGNOSIS — Z0000 Encounter for general adult medical examination without abnormal findings: Secondary | ICD-10-CM

## 2013-03-01 LAB — BASIC METABOLIC PANEL
BUN: 14 mg/dL (ref 6–23)
Calcium: 9.7 mg/dL (ref 8.4–10.5)
Chloride: 107 mEq/L (ref 96–112)
Creatinine, Ser: 1.2 mg/dL (ref 0.4–1.5)
GFR: 80.65 mL/min (ref >60.00)

## 2013-03-01 LAB — LIPID PANEL
Cholesterol: 230 mg/dL — ABNORMAL HIGH (ref 0–200)
HDL: 46.4 mg/dL (ref >39.00)
Total CHOL/HDL Ratio: 5
Triglycerides: 458 mg/dL — ABNORMAL HIGH (ref 0.0–149.0)
VLDL: 91.6 mg/dL — ABNORMAL HIGH (ref 0.0–40.0)

## 2013-03-01 LAB — CBC WITH DIFFERENTIAL/PLATELET
Basophils Absolute: 0 10*3/uL (ref 0.0–0.1)
Eosinophils Absolute: 0.2 10*3/uL (ref 0.0–0.7)
Eosinophils Relative: 3.7 % (ref 0.0–5.0)
HCT: 39 % (ref 39.0–52.0)
Lymphocytes Relative: 41.2 % (ref 12.0–46.0)
MCHC: 33.9 g/dL (ref 30.0–36.0)
Monocytes Relative: 8.9 % (ref 3.0–12.0)
Neutro Abs: 2.8 10*3/uL (ref 1.4–7.7)
Neutrophils Relative %: 45.8 % (ref 43.0–77.0)
Platelets: 275 10*3/uL (ref 150.0–400.0)
RDW: 14.2 % (ref 11.5–14.6)

## 2013-03-01 LAB — HEPATIC FUNCTION PANEL
ALT: 18 U/L (ref 0–53)
AST: 22 U/L (ref 0–37)
Albumin: 4.4 g/dL (ref 3.5–5.2)
Alkaline Phosphatase: 36 U/L — ABNORMAL LOW (ref 39–117)
Total Protein: 7.1 g/dL (ref 6.0–8.3)

## 2013-03-01 LAB — URINALYSIS, ROUTINE W REFLEX MICROSCOPIC
Ketones, ur: NEGATIVE
Leukocytes, UA: NEGATIVE
Nitrite: NEGATIVE
Specific Gravity, Urine: >=1.03 (ref 1.000–1.030)
Urobilinogen, UA: 0.2 (ref 0.0–1.0)
pH: 6 (ref 5.0–8.0)

## 2013-03-01 LAB — TSH: TSH: 1.83 u[IU]/mL (ref 0.35–5.50)

## 2013-03-01 NOTE — Telephone Encounter (Signed)
Called and stated pt is in the lab have cpx appt next week. Wanting his cpx labs done. Inform Chip Boer will enter this time, but per office rules pt must see md first before labs can be done...lmb

## 2013-03-03 NOTE — Telephone Encounter (Signed)
Noted and agree. 

## 2013-03-06 ENCOUNTER — Encounter: Payer: Self-pay | Admitting: Internal Medicine

## 2013-03-06 ENCOUNTER — Encounter: Payer: Self-pay | Admitting: *Deleted

## 2013-03-06 ENCOUNTER — Ambulatory Visit (INDEPENDENT_AMBULATORY_CARE_PROVIDER_SITE_OTHER): Payer: 59 | Admitting: Internal Medicine

## 2013-03-06 VITALS — BP 130/80 | HR 80 | Temp 98.2°F | Ht 70.0 in | Wt 173.8 lb

## 2013-03-06 DIAGNOSIS — Z23 Encounter for immunization: Secondary | ICD-10-CM

## 2013-03-06 DIAGNOSIS — M5136 Other intervertebral disc degeneration, lumbar region: Secondary | ICD-10-CM

## 2013-03-06 DIAGNOSIS — M5137 Other intervertebral disc degeneration, lumbosacral region: Secondary | ICD-10-CM

## 2013-03-06 DIAGNOSIS — Z0000 Encounter for general adult medical examination without abnormal findings: Secondary | ICD-10-CM

## 2013-03-06 DIAGNOSIS — Z1211 Encounter for screening for malignant neoplasm of colon: Secondary | ICD-10-CM

## 2013-03-06 MED ORDER — CYCLOBENZAPRINE HCL 5 MG PO TABS: 5.0000 mg | ORAL_TABLET | Freq: Three times a day (TID) | ORAL | Status: DC | PRN

## 2013-03-06 NOTE — Patient Instructions (Addendum)
It was good to see you today.  We have reviewed your prior records including labs and tests today  Health Maintenance reviewed - all recommended immunizations and age-appropriate screenings are up-to-date.  Your annual flu shot was given and/or updated today.  we'll make referral to gastroenterology for your colonoscopy (colon screening). Our office will contact you regarding appointment(s) once made.  Medications reviewed and updated, okay for Flexeril muscle relaxer to use as needed -no other changes recommended at this time.  Your prescription(s) have been submitted to your pharmacy. Please take as directed and contact our office if you believe you are having problem(s) with the medication(s).  If back pain symptoms are severe, frequently recurrent or otherwise unimproved, please call for further evaluation and treatment of this problem as needed  Followup in 12 months for annual medical physical and labs, call sooner if problems  Health Maintenance, Males A healthy lifestyle and preventative care can promote health and wellness.  Maintain regular health, dental, and eye exams.  Eat a healthy diet. Foods like vegetables, fruits, whole grains, low-fat dairy products, and lean protein foods contain the nutrients you need without too many calories. Decrease your intake of foods high in solid fats, added sugars, and salt. Get information about a proper diet from your caregiver, if necessary.  Regular physical exercise is one of the most important things you can do for your health. Most adults should get at least 150 minutes of moderate-intensity exercise (any activity that increases your heart rate and causes you to sweat) each week. In addition, most adults need muscle-strengthening exercises on 2 or more days a week.   Maintain a healthy weight. The body mass index (BMI) is a screening tool to identify possible weight problems. It provides an estimate of body fat based on height and  weight. Your caregiver can help determine your BMI, and can help you achieve or maintain a healthy weight. For adults 20 years and older:  A BMI below 18.5 is considered underweight.  A BMI of 18.5 to 24.9 is normal.  A BMI of 25 to 29.9 is considered overweight.  A BMI of 30 and above is considered obese.  Maintain normal blood lipids and cholesterol by exercising and minimizing your intake of saturated fat. Eat a balanced diet with plenty of fruits and vegetables. Blood tests for lipids and cholesterol should begin at age 71 and be repeated every 5 years. If your lipid or cholesterol levels are high, you are over 50, or you are a high risk for heart disease, you may need your cholesterol levels checked more frequently.Ongoing high lipid and cholesterol levels should be treated with medicines, if diet and exercise are not effective.  If you smoke, find out from your caregiver how to quit. If you do not use tobacco, do not start.  If you choose to drink alcohol, do not exceed 2 drinks per day. One drink is considered to be 12 ounces (355 mL) of beer, 5 ounces (148 mL) of wine, or 1.5 ounces (44 mL) of liquor.  Avoid use of street drugs. Do not share needles with anyone. Ask for help if you need support or instructions about stopping the use of drugs.  High blood pressure causes heart disease and increases the risk of stroke. Blood pressure should be checked at least every 1 to 2 years. Ongoing high blood pressure should be treated with medicines if weight loss and exercise are not effective.  If you are 54 to 50 years old,  ask your caregiver if you should take aspirin to prevent heart disease.  Diabetes screening involves taking a blood sample to check your fasting blood sugar level. This should be done once every 3 years, after age 35, if you are within normal weight and without risk factors for diabetes. Testing should be considered at a younger age or be carried out more frequently if you  are overweight and have at least 1 risk factor for diabetes.  Colorectal cancer can be detected and often prevented. Most routine colorectal cancer screening begins at the age of 51 and continues through age 77. However, your caregiver may recommend screening at an earlier age if you have risk factors for colon cancer. On a yearly basis, your caregiver may provide home test kits to check for hidden blood in the stool. Use of a small camera at the end of a tube, to directly examine the colon (sigmoidoscopy or colonoscopy), can detect the earliest forms of colorectal cancer. Talk to your caregiver about this at age 38, when routine screening begins. Direct examination of the colon should be repeated every 5 to 10 years through age 41, unless early forms of pre-cancerous polyps or small growths are found.  Hepatitis C blood testing is recommended for all people born from [birth year] through 1965 and any individual with known risks for hepatitis C.  Healthy men should no longer receive prostate-specific antigen (PSA) blood tests as part of routine cancer screening. Consult with your caregiver about prostate cancer screening.  Testicular cancer screening is not recommended for adolescents or adult males who have no symptoms. Screening includes self-exam, caregiver exam, and other screening tests. Consult with your caregiver about any symptoms you have or any concerns you have about testicular cancer.  Practice safe sex. Use condoms and avoid high-risk sexual practices to reduce the spread of sexually transmitted infections (STIs).  Use sunscreen with a sun protection factor (SPF) of 30 or greater. Apply sunscreen liberally and repeatedly throughout the day. You should seek shade when your shadow is shorter than you. Protect yourself by wearing long sleeves, pants, a wide-brimmed hat, and sunglasses year round, whenever you are outdoors.  Notify your caregiver of new moles or changes in moles, especially if there  is a change in shape or color. Also notify your caregiver if a mole is larger than the size of a pencil eraser.  A one-time screening for abdominal aortic aneurysm (AAA) and surgical repair of large AAAs by sound wave imaging (ultrasonography) is recommended for ages 32 to 92 years who are current or former smokers.  Stay current with your immunizations. Document Released: 11/12/2007 Document Revised: 08/08/2011 Document Reviewed: 10/11/2010 [facility] Patient Information 2014 [place], [place]. Back Exercises Back exercises help treat and prevent back injuries. The goal is to increase your strength in your belly (abdominal) and back muscles. These exercises can also help with flexibility. Start these exercises when told by your doctor. HOME CARE Back exercises include: Pelvic Tilt.  Lie on your back with your knees bent. Tilt your pelvis until the lower part of your back is against the floor. Hold this position 5 to 10 sec. Repeat this exercise 5 to 10 times. Knee to Chest.  Pull 1 knee up against your chest and hold for 20 to 30 seconds. Repeat this with the other knee. This may be done with the other leg straight or bent, whichever feels better. Then, pull both knees up against your chest. Sit-Ups or Curl-Ups.  Bend your knees 90 degrees.  Start with tilting your pelvis, and do a partial, slow sit-up. Only lift your upper half 30 to 45 degrees off the floor. Take at least 2 to 3 seonds for each sit-up. Do not do sit-ups with your knees out straight. If partial sit-ups are difficult, simply do the above but with only tightening your belly (abdominal) muscles and holding it as told. Hip-Lift.  Lie on your back with your knees flexed 90 degrees. Push down with your feet and shoulders as you raise your hips 2 inches off the floor. Hold for 10 seconds, repeat 5 to 10 times. Back Arches.  Lie on your stomach. Prop yourself up on bent elbows. Slowly press on your hands, causing an arch in your low  back. Repeat 3 to 5 times. Shoulder-Lifts.  Lie face down with arms beside your body. Keep hips and belly pressed to floor as you slowly lift your head and shoulders off the floor. Do not overdo your exercises. Be careful in the beginning. Exercises may cause you some mild back discomfort. If the pain lasts for more than 15 minutes, stop the exercises until you see your doctor. Improvement with exercise for back problems is slow.  Document Released: 06/18/2010 Document Revised: 08/08/2011 Document Reviewed: 03/17/2011 [facility] Patient Information 2014 [place], [place].

## 2013-03-06 NOTE — Assessment & Plan Note (Signed)
Episodic symptoms with new right radicular pain in past 6 months, none currently present this time. Conservative treatment at home with rest and anti-inflammatory over-the-counter Aleve as needed. Provide Flexeril to use during flare. Patient agrees to call if symptoms severe, frequent recurrence otherwise unimproved

## 2013-03-06 NOTE — Progress Notes (Signed)
Subjective:    Patient ID: Jim Jones, male    DOB: 27-Jul-1960, 52 y.o.   MRN: [id number]  HPI patient is here today for annual physical. Patient feels well and has no complaints.   Past Medical History  Diagnosis Date  . OA (osteoarthritis)   . Low back pain 2009    Hx right side ruptured disc  . CHICKENPOX, HX OF   . MIGRAINE HEADACHE   . RENAL CALCULUS, HX OF 2000   Family History  Problem Relation Age of Onset  . Diabetes Mother   . Hypertension Mother   . Prostate cancer Father    History  Substance Use Topics  . Smoking status: Never Smoker   . Smokeless tobacco: Not on file     Comment: Married, lives with spouse, son and twins dtrs. 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 Alcohol Use: Yes     Comment: Occassional   Review of Systems Constitutional: Negative for fever or unexpected weight gain.  Respiratory: Negative for cough and shortness of breath.   Cardiovascular: Negative for chest pain.  Gastrointestinal: Negative for abdominal pain.  Musculoskeletal: Negative for gait problem. Periodic episodes of back pain with radiation down right leg/posterior thigh -none present at this time Skin: Negative for rash.  Neurological: Negative for dizziness.  No other specific complaints in a complete review of systems (except as listed in HPI above).     Objective:   Physical Exam BP 130/80  Pulse 80  Temp(Src) 98.2 F (36.8 C) (Oral)  Ht 5\' 10"  (1.778 m)  Wt 173 lb 12.8 oz (78.835 kg)  BMI 24.94 kg/m2  SpO2 97% Wt Readings from Last 3 Encounters:  03/06/13 173 lb 12.8 oz (78.835 kg)  10/01/12 171 lb (77.565 kg)  12/15/11 169 lb 1.9 oz (76.712 kg)   Constitutional: he appears well-developed and well-nourished. No distress.  Neck: Normal range of motion. Neck supple. No JVD present. No thyromegaly present.  Cardiovascular: Normal rate, regular rhythm and normal heart sounds.  No murmur heard. no BLE edema Pulmonary/Chest: Effort normal and  breath sounds normal. No respiratory distress. no wheezes.  Abdominal: Soft. Bowel sounds are normal. Patient exhibits no distension. There is no tenderness.  Musculoskeletal: Normal range of motion. Back: full range of motion of thoracic and lumbar spine. Non tender to palpation. Negative straight leg raise. DTR's are symmetrically intact. Sensation intact in all dermatomes of the lower extremities. Full strength to manual muscle testing. the patient is able to heel toe walk without difficulty and ambulates with a normal gait. GU: defer Neurological: he is alert and oriented to person, place, and time. No cranial nerve deficit. Coordination normal.  Skin: Skin is warm and dry.  No erythema or ulceration.  Psychiatric: he has a normal mood and affect. behavior is normal. Judgment and thought content normal.   Lab Results  Component Value Date   WBC 6.0 03/01/2013   HGB 13.2 03/01/2013   HCT 39.0 03/01/2013   PLT 275.0 03/01/2013   CHOL 230* 03/01/2013   TRIG 458.0* 03/01/2013   HDL 46.40 03/01/2013   LDLDIRECT 95.1 03/01/2013   ALT 18 03/01/2013   AST 22 03/01/2013   NA 139 03/01/2013   K 4.5 03/01/2013   CL 107 03/01/2013   CREATININE 1.2 03/01/2013   BUN 14 03/01/2013   CO2 27 03/01/2013   TSH 1.83 03/01/2013   PSA 0.23 03/01/2013   HGBA1C 5.8 08/14/2007       Assessment &  Plan:   CPX/v70.0 - Patient has been counseled on age-appropriate routine health concerns for screening and prevention. These are reviewed and up-to-date. Immunizations are up-to-date or declined. Labs reviewed.

## 2013-05-01 ENCOUNTER — Encounter: Payer: Self-pay | Admitting: Internal Medicine

## 2013-05-09 DIAGNOSIS — Z0279 Encounter for issue of other medical certificate: Secondary | ICD-10-CM

## 2013-11-20 ENCOUNTER — Emergency Department (INDEPENDENT_AMBULATORY_CARE_PROVIDER_SITE_OTHER): Payer: Worker's Compensation

## 2013-11-20 ENCOUNTER — Emergency Department (INDEPENDENT_AMBULATORY_CARE_PROVIDER_SITE_OTHER)
Admission: EM | Admit: 2013-11-20 | Discharge: 2013-11-20 | Disposition: A | Discharge status: Home / Self Care | Payer: 59 | Source: Home / Self Care | Attending: Family Medicine | Admitting: Family Medicine

## 2013-11-20 ENCOUNTER — Encounter (HOSPITAL_COMMUNITY): Payer: Self-pay | Admitting: Emergency Medicine

## 2013-11-20 DIAGNOSIS — M25539 Pain in unspecified wrist: Secondary | ICD-10-CM

## 2013-11-20 DIAGNOSIS — M25529 Pain in unspecified elbow: Secondary | ICD-10-CM

## 2013-11-20 DIAGNOSIS — S20219A Contusion of unspecified front wall of thorax, initial encounter: Secondary | ICD-10-CM

## 2013-11-20 DIAGNOSIS — S20212A Contusion of left front wall of thorax, initial encounter: Secondary | ICD-10-CM

## 2013-11-20 DIAGNOSIS — M25522 Pain in left elbow: Secondary | ICD-10-CM

## 2013-11-20 DIAGNOSIS — M25532 Pain in left wrist: Secondary | ICD-10-CM

## 2013-11-20 MED ORDER — DICLOFENAC SODIUM 50 MG PO TBEC: 50.0000 mg | DELAYED_RELEASE_TABLET | Freq: Two times a day (BID) | ORAL | Status: DC | PRN
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
## 2013-12-06 ENCOUNTER — Encounter: Payer: Self-pay | Admitting: Internal Medicine

## 2013-12-06 ENCOUNTER — Ambulatory Visit (INDEPENDENT_AMBULATORY_CARE_PROVIDER_SITE_OTHER): Payer: 59 | Admitting: Internal Medicine

## 2013-12-06 VITALS — BP 130/76 | HR 85 | Temp 98.1°F | Wt 176.4 lb

## 2013-12-06 DIAGNOSIS — M65839 Other synovitis and tenosynovitis, unspecified forearm: Secondary | ICD-10-CM

## 2013-12-06 DIAGNOSIS — M659 Synovitis and tenosynovitis, unspecified: Secondary | ICD-10-CM

## 2013-12-06 DIAGNOSIS — M65849 Other synovitis and tenosynovitis, unspecified hand: Secondary | ICD-10-CM

## 2013-12-06 MED ORDER — HYDROCODONE-ACETAMINOPHEN 10-325 MG PO TABS: 1.0000 | ORAL_TABLET | Freq: Four times a day (QID) | ORAL | Status: DC | PRN

## 2013-12-06 NOTE — Patient Instructions (Signed)
Use an anti-inflammatory cream such as Aspercreme or Zostrix cream twice a day to the affected area as needed. In lieu of this warm moist compresses or  hot water bottle can be used. Do not apply ice .I recommend you see Dr Terrilee FilesZach Smith, Sports Medicine specialist., phone # [phone].

## 2013-12-06 NOTE — Progress Notes (Signed)
Pre visit review using our clinic review tool, if applicable. No additional management support is needed unless otherwise documented below in the visit note. 
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
## 2013-12-23 ENCOUNTER — Ambulatory Visit (INDEPENDENT_AMBULATORY_CARE_PROVIDER_SITE_OTHER): Payer: 59 | Admitting: Family Medicine

## 2013-12-23 ENCOUNTER — Encounter: Payer: Self-pay | Admitting: Family Medicine

## 2013-12-23 ENCOUNTER — Other Ambulatory Visit (INDEPENDENT_AMBULATORY_CARE_PROVIDER_SITE_OTHER): Payer: 59

## 2013-12-23 VITALS — BP 132/82 | HR 77 | Ht 69.5 in | Wt 174.0 lb

## 2013-12-23 DIAGNOSIS — S62002A Unspecified fracture of navicular [scaphoid] bone of left wrist, initial encounter for closed fracture: Secondary | ICD-10-CM

## 2013-12-23 DIAGNOSIS — M25532 Pain in left wrist: Secondary | ICD-10-CM

## 2013-12-23 DIAGNOSIS — M25539 Pain in unspecified wrist: Secondary | ICD-10-CM

## 2013-12-23 DIAGNOSIS — S62009A Unspecified fracture of navicular [scaphoid] bone of unspecified wrist, initial encounter for closed fracture: Secondary | ICD-10-CM

## 2013-12-23 NOTE — Assessment & Plan Note (Addendum)
Patient was fitted with a custom brace by me today. We discussed icing protocol as well as range of motion exercises. We discussed patient using Tylenol and avoiding anti-inflammatories. Patient and will come back and see me again in 2-3 weeks. If he continues to have pain we need to consider further imaging such as an MRI the we'll discuss the followup. We discussed the possibility of an x-ray today which patient declined secondary to financial issues.

## 2013-12-23 NOTE — Patient Instructions (Signed)
Good to se eyou Ice 20 minutes 2 times daily. Usually after activity and before bed. Exercises focusing on range of motion only 1 time daily.  Wear brace day and night and almost all the time until I see you again Vitamin D 2000 IU daily Come back in 2-3 weeks to see how you are doing.  If still in pain we may need to consider more imaging.
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
## 2014-01-10 ENCOUNTER — Encounter: Payer: Self-pay | Admitting: Family Medicine

## 2014-01-10 ENCOUNTER — Other Ambulatory Visit (INDEPENDENT_AMBULATORY_CARE_PROVIDER_SITE_OTHER): Payer: 59

## 2014-01-10 ENCOUNTER — Ambulatory Visit (INDEPENDENT_AMBULATORY_CARE_PROVIDER_SITE_OTHER): Payer: 59 | Admitting: Family Medicine

## 2014-01-10 VITALS — BP 124/82 | HR 83 | Ht 70.0 in | Wt 176.0 lb

## 2014-01-10 DIAGNOSIS — M25532 Pain in left wrist: Secondary | ICD-10-CM

## 2014-01-10 DIAGNOSIS — S62002A Unspecified fracture of navicular [scaphoid] bone of left wrist, initial encounter for closed fracture: Secondary | ICD-10-CM

## 2014-01-10 DIAGNOSIS — S62009A Unspecified fracture of navicular [scaphoid] bone of unspecified wrist, initial encounter for closed fracture: Secondary | ICD-10-CM

## 2014-01-10 DIAGNOSIS — M25539 Pain in unspecified wrist: Secondary | ICD-10-CM

## 2014-01-10 NOTE — Patient Instructions (Signed)
Goo dot see you We discussed you would heal faster being out of work but I understand.  Wear the brace still as much as possible for the next 2 weeks.  Ice at end of long day. And before bed.  Exercises still most days of the week.  Come back in 4 weeks to make sure completely healed.

## 2014-01-10 NOTE — Progress Notes (Signed)
  Tawana ScaleZach Smith D.O. [facility] [address] [place], [place] [address] Phone: [phone] Subjective:      CC: Wrist pain followup  ZHY:[id number]:Subjective Jim LauberJohnny Jones Jones is a 53 y.o. male coming in with complaint of  Resting. Patient was seen previously and did have a fracture of the scaphoid. This was part of the proximal pole and likely was going to do very well. Patient needed to continue to work secondary to financial constraints. Patient states that overall he is doing relatively well and states that he is 60% better. Patient wears the brace whenever he can but has taken off at work. Patient is wearing it at night and as well as at home. Patient is doing home exercises as well as the icing. Patient returns raiding himself approximately 50% better.    Past medical history, social, surgical and family history all reviewed in electronic medical record.   Review of Systems: No headache, visual changes, nausea, vomiting, diarrhea, constipation, dizziness, abdominal pain, skin rash, fevers, chills, night sweats, weight loss, swollen lymph nodes, body aches, joint swelling, muscle aches, chest pain, shortness of breath, mood changes.   Objective Blood pressure 124/82, pulse 83, height 5\' 10"  (1.778 m), weight 176 lb (79.833 kg), SpO2 98.00%.  General: No apparent distress alert and oriented x3 mood and affect normal, dressed appropriately.  HEENT: Pupils equal, extraocular movements intact  Respiratory: Patient'Jones speak in full sentences and does not appear short of breath  Cardiovascular: No lower extremity edema, non tender, no erythema  Skin: Warm dry intact with no signs of infection or rash on extremities or on axial skeleton.  Abdomen: Soft nontender  Neuro: Cranial nerves II through XII are intact, neurovascularly intact in all extremities with 2+ DTRs and 2+ pulses.  Lymph: No lymphadenopathy of posterior or anterior cervical chain or axillae bilaterally.  Gait normal with  good balance and coordination.  MSK:  Non tender with full range of motion and good stability and symmetric strength and tone of shoulders, elbows, hip, knee and ankles bilaterally.  Wrist: Left Inspection normal with no visible erythema or swelling. ROM smooth and normal with good flexion and extension and ulnar/radial deviation that is symmetrical with opposite wrist. Palpation is normal over metacarpals, navicular, Patient does have mild discomfort of the lunate bone as well as the TFCC.  Significant decrease in tenderness over the snuff box No tenderness over Canal of Guyon. Strength 5/5 in all directions without pain. Negative Finkelstein, tinel'Jones and phalens. Negative Watson'Jones test. Contralateral wrist unremarkable  MSK US performed of: Left wrist This study was ordered, performed, and interpreted by Terrilee FilesZach Smith D.O.  Wrist: All extensor compartments visualized and tendons all normal in appearance without fraying, tears, or sheath effusions. No effusion seen. TFCC appears to be intact Scapholunate ligament intact with decreased hypoechoic changes Scaphoid bone at the waist t does have significant callus formation no osteolytic lesion noted Nondisplaced.  Carpal tunnel visualized and median nerve area normal, flexor tendons all normal in appearance without fraying, tears, or sheath effusions. Power doppler signal normal.  IMPRESSION:  Scaphoid fracture with injury to scaphoid lunate ligament but both seemed to be healing      Impression and Recommendations:     This case required medical decision making of moderate complexity.

## 2014-01-10 NOTE — Assessment & Plan Note (Signed)
Patient is doing remarkably well even though he is fairly noncompliant. We discussed again the benefit of possibly getting an x-ray which she declined again secondary to financial constraints. Patient does hold that he would likely heal better if he stayed out of work but patient declined. He will remain wearing the brace as much as possible over the course of the next 2 weeks. We discussed icing and range of motion exercises avoiding any significant lifting until I see patient again in 3-4 weeks.

## 2014-02-04 ENCOUNTER — Ambulatory Visit (INDEPENDENT_AMBULATORY_CARE_PROVIDER_SITE_OTHER)
Admission: RE | Admit: 2014-02-04 | Discharge: 2014-02-04 | Disposition: A | Discharge status: Home / Self Care | Payer: 59 | Source: Ambulatory Visit | Attending: Family Medicine | Admitting: Family Medicine

## 2014-02-04 ENCOUNTER — Ambulatory Visit (INDEPENDENT_AMBULATORY_CARE_PROVIDER_SITE_OTHER): Payer: 59 | Admitting: Family Medicine

## 2014-02-04 ENCOUNTER — Encounter: Payer: Self-pay | Admitting: Family Medicine

## 2014-02-04 ENCOUNTER — Other Ambulatory Visit: Payer: 59

## 2014-02-04 VITALS — BP 130/84 | HR 85 | Ht 70.0 in | Wt 172.0 lb

## 2014-02-04 DIAGNOSIS — S62002A Unspecified fracture of navicular [scaphoid] bone of left wrist, initial encounter for closed fracture: Secondary | ICD-10-CM

## 2014-02-04 DIAGNOSIS — S62009A Unspecified fracture of navicular [scaphoid] bone of unspecified wrist, initial encounter for closed fracture: Secondary | ICD-10-CM

## 2014-02-04 NOTE — Patient Instructions (Signed)
It is good to see you! You are doing great.  Get xrays downstairs today. No news is good news  Ice is your friend Next 2 weeks still wear brace when out of the house.  No brace at night.  See me when you need me.
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
## 2014-02-05 ENCOUNTER — Telehealth: Payer: Self-pay | Admitting: Internal Medicine

## 2014-02-05 DIAGNOSIS — Z1211 Encounter for screening for malignant neoplasm of colon: Secondary | ICD-10-CM

## 2014-02-05 NOTE — Telephone Encounter (Signed)
Pt send massage from mychart stated that we suppose to refer him out for colonoscopy but he never got an appt. Per Epic, GI called and left vm to return their call on 03/12/14. Please put in another referral for GI because the last one expire in 08/2013.

## 2014-02-11 ENCOUNTER — Ambulatory Visit: Payer: Self-pay

## 2014-03-04 ENCOUNTER — Encounter: Payer: Self-pay | Admitting: Internal Medicine

## 2014-03-10 ENCOUNTER — Other Ambulatory Visit (INDEPENDENT_AMBULATORY_CARE_PROVIDER_SITE_OTHER): Payer: 59

## 2014-03-10 ENCOUNTER — Encounter: Payer: Self-pay | Admitting: Internal Medicine

## 2014-03-10 ENCOUNTER — Ambulatory Visit (INDEPENDENT_AMBULATORY_CARE_PROVIDER_SITE_OTHER): Payer: 59 | Admitting: Internal Medicine

## 2014-03-10 VITALS — BP 122/84 | HR 66 | Temp 98.2°F | Ht 70.0 in | Wt 174.1 lb

## 2014-03-10 DIAGNOSIS — Z0000 Encounter for general adult medical examination without abnormal findings: Secondary | ICD-10-CM

## 2014-03-10 DIAGNOSIS — Z23 Encounter for immunization: Secondary | ICD-10-CM

## 2014-03-10 LAB — HEPATIC FUNCTION PANEL
ALBUMIN: 4.1 g/dL (ref 3.5–5.2)
ALT: 24 U/L (ref 0–53)
AST: 20 U/L (ref 0–37)
Alkaline Phosphatase: 31 U/L — ABNORMAL LOW (ref 39–117)
Bilirubin, Direct: 0.1 mg/dL (ref 0.0–0.3)
Total Bilirubin: 0.7 mg/dL (ref 0.2–1.2)
Total Protein: 7.7 g/dL (ref 6.0–8.3)

## 2014-03-10 LAB — BASIC METABOLIC PANEL
BUN: 16 mg/dL (ref 6–23)
CALCIUM: 9.8 mg/dL (ref 8.4–10.5)
CO2: 27 mEq/L (ref 19–32)
CREATININE: 1.1 mg/dL (ref 0.4–1.5)
Chloride: 106 mEq/L (ref 96–112)
GFR: 88.65 mL/min (ref >60.00)
Glucose, Bld: 96 mg/dL (ref 70–99)
Potassium: 5 mEq/L (ref 3.5–5.1)
SODIUM: 140 meq/L (ref 135–145)

## 2014-03-10 LAB — LIPID PANEL
CHOLESTEROL: 232 mg/dL — AB (ref 0–200)
HDL: 42.5 mg/dL (ref >39.00)
LDL Cholesterol: 157 mg/dL — ABNORMAL HIGH (ref 0–99)
NonHDL: 189.5
Total CHOL/HDL Ratio: 5
Triglycerides: 165 mg/dL — ABNORMAL HIGH (ref 0.0–149.0)
VLDL: 33 mg/dL (ref 0.0–40.0)

## 2014-03-10 LAB — CBC WITH DIFFERENTIAL/PLATELET
Basophils Absolute: 0 10*3/uL (ref 0.0–0.1)
Basophils Relative: 0.4 % (ref 0.0–3.0)
EOS ABS: 0.2 10*3/uL (ref 0.0–0.7)
Eosinophils Relative: 3.5 % (ref 0.0–5.0)
HEMATOCRIT: 40.7 % (ref 39.0–52.0)
HEMOGLOBIN: 13.4 g/dL (ref 13.0–17.0)
LYMPHS ABS: 2.5 10*3/uL (ref 0.7–4.0)
Lymphocytes Relative: 39.7 % (ref 12.0–46.0)
MCHC: 32.9 g/dL (ref 30.0–36.0)
MCV: 85.9 fl (ref 78.0–100.0)
MONOS PCT: 9.8 % (ref 3.0–12.0)
Monocytes Absolute: 0.6 10*3/uL (ref 0.1–1.0)
NEUTROS ABS: 3 10*3/uL (ref 1.4–7.7)
Neutrophils Relative %: 46.6 % (ref 43.0–77.0)
Platelets: 292 10*3/uL (ref 150.0–400.0)
RBC: 4.74 Mil/uL (ref 4.22–5.81)
RDW: 13.8 % (ref 11.5–15.5)
WBC: 6.3 10*3/uL (ref 4.0–10.5)

## 2014-03-10 LAB — URINALYSIS, ROUTINE W REFLEX MICROSCOPIC
Bilirubin Urine: NEGATIVE
Hgb urine dipstick: NEGATIVE
Ketones, ur: NEGATIVE
LEUKOCYTES UA: NEGATIVE
NITRITE: NEGATIVE
PH: 5.5 (ref 5.0–8.0)
RBC / HPF: NONE SEEN (ref 0–?)
Total Protein, Urine: NEGATIVE
Urine Glucose: NEGATIVE
Urobilinogen, UA: 0.2 (ref 0.0–1.0)
WBC UA: NONE SEEN (ref 0–?)

## 2014-03-10 LAB — TSH: TSH: 1.71 u[IU]/mL (ref 0.35–4.50)

## 2014-03-10 LAB — PSA: PSA: 0.3 ng/mL (ref 0.10–4.00)

## 2014-03-10 MED ORDER — CYCLOBENZAPRINE HCL 5 MG PO TABS: 5.0000 mg | ORAL_TABLET | Freq: Three times a day (TID) | ORAL | Status: AC | PRN
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
## 2014-03-10 NOTE — Progress Notes (Signed)
Pre visit review using our clinic review tool, if applicable. No additional management support is needed unless otherwise documented below in the visit note. 

## 2014-03-10 NOTE — Patient Instructions (Addendum)
It was good to see you today.  We have reviewed your prior records including labs and tests today  Health Maintenance reviewed - Your annual flu shot was given and/or updated today. All other recommended immunizations and age-appropriate screenings are up-to-date.  Test(s) ordered today. Your results will be released to MyChart (or called to you) after review, usually within 72hours after test completion. If any changes need to be made, you will be notified at that same time.  Medications reviewed and updated, no changes recommended at this time. Refill on medication(s) as discussed today.  Please schedule followup in 12 months for annual exam and labs, call sooner if problems.  Health Maintenance A healthy lifestyle and preventative care can promote health and wellness.  Maintain regular health, dental, and eye exams.  Eat a healthy diet. Foods like vegetables, fruits, whole grains, low-fat dairy products, and lean protein foods contain the nutrients you need and are low in calories. Decrease your intake of foods high in solid fats, added sugars, and salt. Get information about a proper diet from your health care provider, if necessary.  Regular physical exercise is one of the most important things you can do for your health. Most adults should get at least 150 minutes of moderate-intensity exercise (any activity that increases your heart rate and causes you to sweat) each week. In addition, most adults need muscle-strengthening exercises on 2 or more days a week.   Maintain a healthy weight. The body mass index (BMI) is a screening tool to identify possible weight problems. It provides an estimate of body fat based on height and weight. Your health care provider can find your BMI and can help you achieve or maintain a healthy weight. For males 20 years and older:  A BMI below 18.5 is considered underweight.  A BMI of 18.5 to 24.9 is normal.  A BMI of 25 to 29.9 is considered  overweight.  A BMI of 30 and above is considered obese.  Maintain normal blood lipids and cholesterol by exercising and minimizing your intake of saturated fat. Eat a balanced diet with plenty of fruits and vegetables. Blood tests for lipids and cholesterol should begin at age 51 and be repeated every 5 years. If your lipid or cholesterol levels are high, you are over age 51, or you are at high risk for heart disease, you may need your cholesterol levels checked more frequently.Ongoing high lipid and cholesterol levels should be treated with medicines if diet and exercise are not working.  If you smoke, find out from your health care provider how to quit. If you do not use tobacco, do not start.  Lung cancer screening is recommended for adults aged 55-80 years who are at high risk for developing lung cancer because of a history of smoking. A yearly low-dose CT scan of the lungs is recommended for people who have at least a 30-pack-year history of smoking and are current smokers or have quit within the past 15 years. A pack year of smoking is smoking an average of 1 pack of cigarettes a day for 1 year (for example, a 30-pack-year history of smoking could mean smoking 1 pack a day for 30 years or 2 packs a day for 15 years). Yearly screening should continue until the smoker has stopped smoking for at least 15 years. Yearly screening should be stopped for people who develop a health problem that would prevent them from having lung cancer treatment.  If you choose to drink alcohol, do  not have more than 2 drinks per day. One drink is considered to be 12 oz (360 mL) of beer, 5 oz (150 mL) of wine, or 1.5 oz (45 mL) of liquor.  Avoid the use of street drugs. Do not share needles with anyone. Ask for help if you need support or instructions about stopping the use of drugs.  High blood pressure causes heart disease and increases the risk of stroke. Blood pressure should be checked at least every 1-2 years.  Ongoing high blood pressure should be treated with medicines if weight loss and exercise are not effective.  If you are 28-52 years old, ask your health care provider if you should take aspirin to prevent heart disease.  Diabetes screening involves taking a blood sample to check your fasting blood sugar level. This should be done once every 3 years after age 28 if you are at a normal weight and without risk factors for diabetes. Testing should be considered at a younger age or be carried out more frequently if you are overweight and have at least 1 risk factor for diabetes.  Colorectal cancer can be detected and often prevented. Most routine colorectal cancer screening begins at the age of 67 and continues through age 33. However, your health care provider may recommend screening at an earlier age if you have risk factors for colon cancer. On a yearly basis, your health care provider may provide home test kits to check for hidden blood in the stool. A small camera at the end of a tube may be used to directly examine the colon (sigmoidoscopy or colonoscopy) to detect the earliest forms of colorectal cancer. Talk to your health care provider about this at age 70 when routine screening begins. A direct exam of the colon should be repeated every 5-10 years through age 32, unless early forms of precancerous polyps or small growths are found.  People who are at an increased risk for hepatitis B should be screened for this virus. You are considered at high risk for hepatitis B if:  You were born in a country where hepatitis B occurs often. Talk with your health care provider about which countries are considered high risk.  Your parents were born in a high-risk country and you have not received a shot to protect against hepatitis B (hepatitis B vaccine).  You have HIV or AIDS.  You use needles to inject street drugs.  You live with, or have sex with, someone who has hepatitis B.  You are a man who has  sex with other men (MSM).  You get hemodialysis treatment.  You take certain medicines for conditions like cancer, organ transplantation, and autoimmune conditions.  Hepatitis C blood testing is recommended for all people born from [birth year] through 1963 and any individual with known risk factors for hepatitis C.  Healthy men should no longer receive prostate-specific antigen (PSA) blood tests as part of routine cancer screening. Talk to your health care provider about prostate cancer screening.  Testicular cancer screening is not recommended for adolescents or adult males who have no symptoms. Screening includes self-exam, a health care provider exam, and other screening tests. Consult with your health care provider about any symptoms you have or any concerns you have about testicular cancer.  Practice safe sex. Use condoms and avoid high-risk sexual practices to reduce the spread of sexually transmitted infections (STIs).  You should be screened for STIs, including gonorrhea and chlamydia if:  You are sexually active and are younger than  24 years.  You are older than 24 years, and your health care provider tells you that you are at risk for this type of infection.  Your sexual activity has changed since you were last screened, and you are at an increased risk for chlamydia or gonorrhea. Ask your health care provider if you are at risk.  If you are at risk of being infected with HIV, it is recommended that you take a prescription medicine daily to prevent HIV infection. This is called pre-exposure prophylaxis (PrEP). You are considered at risk if:  You are a man who has sex with other men (MSM).  You are a heterosexual man who is sexually active with multiple partners.  You take drugs by injection.  You are sexually active with a partner who has HIV.  Talk with your health care provider about whether you are at high risk of being infected with HIV. If you choose to begin PrEP, you should  first be tested for HIV. You should then be tested every 3 months for as long as you are taking PrEP.  Use sunscreen. Apply sunscreen liberally and repeatedly throughout the day. You should seek shade when your shadow is shorter than you. Protect yourself by wearing long sleeves, pants, a wide-brimmed hat, and sunglasses year round whenever you are outdoors.  Tell your health care provider of new moles or changes in moles, especially if there is a change in shape or color. Also, tell your health care provider if a mole is larger than the size of a pencil eraser.  A one-time screening for abdominal aortic aneurysm (AAA) and surgical repair of large AAAs by ultrasound is recommended for men aged 65-75 years who are current or former smokers.  Stay current with your vaccines (immunizations). Document Released: 11/12/2007 Document Revised: 05/21/2013 Document Reviewed: 10/11/2010 [facility] Patient Information 2015 [employer] [place]. This information is not intended to replace advice given to you by your health care provider. Make sure you discuss any questions you have with your health care provider.

## 2014-03-21 ENCOUNTER — Telehealth: Payer: Self-pay

## 2014-03-21 ENCOUNTER — Telehealth: Payer: Self-pay | Admitting: Internal Medicine

## 2014-03-21 DIAGNOSIS — S62009S Unspecified fracture of navicular [scaphoid] bone of unspecified wrist, sequela: Secondary | ICD-10-CM

## 2014-03-21 NOTE — Telephone Encounter (Signed)
Order placed for therapy

## 2014-03-21 NOTE — Telephone Encounter (Signed)
Pt informed of order being placed.

## 2014-03-21 NOTE — Telephone Encounter (Signed)
Pt came by office stating that he is still having issues with his wrist. Pt states he discussed therapy for his wrist with Dr Felicity CoyerLeschber during his last visit. Pt would like to request referral to receive therapy for his wrist. please contact pt when request is reviewed.

## 2014-03-31 DIAGNOSIS — Z0279 Encounter for issue of other medical certificate: Secondary | ICD-10-CM

## 2014-04-08 ENCOUNTER — Other Ambulatory Visit: Payer: Self-pay | Admitting: Internal Medicine

## 2014-04-08 DIAGNOSIS — M25539 Pain in unspecified wrist: Secondary | ICD-10-CM

## 2014-04-16 ENCOUNTER — Ambulatory Visit: Payer: 59 | Attending: Internal Medicine | Admitting: Occupational Therapy

## 2014-04-16 DIAGNOSIS — R531 Weakness: Secondary | ICD-10-CM | POA: Diagnosis not present

## 2014-04-16 DIAGNOSIS — S62002S Unspecified fracture of navicular [scaphoid] bone of left wrist, sequela: Secondary | ICD-10-CM | POA: Diagnosis not present

## 2014-04-16 DIAGNOSIS — Z5189 Encounter for other specified aftercare: Secondary | ICD-10-CM | POA: Diagnosis present

## 2014-04-16 DIAGNOSIS — X58XXXS Exposure to other specified factors, sequela: Secondary | ICD-10-CM | POA: Insufficient documentation

## 2014-04-16 DIAGNOSIS — M25532 Pain in left wrist: Secondary | ICD-10-CM | POA: Diagnosis not present
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
## 2014-04-17 ENCOUNTER — Encounter: Payer: Self-pay | Admitting: Occupational Therapy

## 2014-04-18 ENCOUNTER — Ambulatory Visit: Payer: 59 | Admitting: Occupational Therapy

## 2014-04-18 DIAGNOSIS — M25532 Pain in left wrist: Secondary | ICD-10-CM

## 2014-04-18 DIAGNOSIS — Z5189 Encounter for other specified aftercare: Secondary | ICD-10-CM | POA: Diagnosis not present

## 2014-04-18 NOTE — Patient Instructions (Addendum)
Pt treatment session was begun in fluidotherapy today to assist with increase AROM, decrease pain/stiffness x7510min. He performed AROM exercises throughout fluidotherapy session as therapist instructed (AROM wrist, flexion/extension, opposition, thumb flexion/extension and fisting).  Home Program:  Pt was instructed in HEP for AROM L wrist and hand to include the following:  1) Tendon gliding (digital exten, hook fist, composite fist, flat fist) 2) Opposition  3) Opposition to with thumb flexion to base of small finger 4) Wrist flexion and extension (with elbow flexed, then extended) 5) Lumbrical stretch  Pt performed all of the above in clinic (5-10 reps each) after instruction and was issued a handout as well. Pt verbalized understanding of above home program and would benefit from review to ensure proper positioning and follow through.
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
## 2014-04-21 ENCOUNTER — Encounter: Payer: Self-pay | Admitting: Occupational Therapy

## 2014-04-21 ENCOUNTER — Ambulatory Visit (AMBULATORY_SURGERY_CENTER): Payer: Self-pay | Admitting: *Deleted

## 2014-04-21 VITALS — Ht 69.5 in | Wt 178.8 lb

## 2014-04-21 DIAGNOSIS — Z1211 Encounter for screening for malignant neoplasm of colon: Secondary | ICD-10-CM

## 2014-04-21 MED ORDER — MOVIPREP 100 G PO SOLR: 1.0000 | Freq: Once | ORAL | Status: DC

## 2014-04-21 NOTE — Progress Notes (Signed)
No egg or soy allergy. ewm No home 02 use. ewm No blood thinners. ewm No issues with your past sedation. ewm Pt declined emmi video. ewm

## 2014-04-29 ENCOUNTER — Ambulatory Visit: Payer: 59 | Attending: Internal Medicine | Admitting: Occupational Therapy

## 2014-04-29 ENCOUNTER — Encounter: Payer: Self-pay | Admitting: Occupational Therapy

## 2014-04-29 DIAGNOSIS — R531 Weakness: Secondary | ICD-10-CM | POA: Insufficient documentation

## 2014-04-29 DIAGNOSIS — M25532 Pain in left wrist: Secondary | ICD-10-CM | POA: Insufficient documentation
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
## 2014-04-29 NOTE — Patient Instructions (Signed)
1. Grip Strengthening (Resistive Putty)   Squeeze putty using thumb and all fingers. Repeat _20___ times. Do __2__ sessions per day.   2. Roll putty into tube on table and pinch between each finger and thumb.  Roll out 3-4 times. (can do ring and small finger together).  2 times/day.     Copyright  VHI. All rights reserved.

## 2014-05-01 ENCOUNTER — Ambulatory Visit: Payer: 59 | Admitting: Occupational Therapy

## 2014-05-02 ENCOUNTER — Ambulatory Visit: Payer: 59 | Admitting: *Deleted

## 2014-05-02 DIAGNOSIS — M25532 Pain in left wrist: Principal | ICD-10-CM

## 2014-05-02 DIAGNOSIS — M2560 Stiffness of unspecified joint, not elsewhere classified: Secondary | ICD-10-CM

## 2014-05-02 DIAGNOSIS — G8929 Other chronic pain: Secondary | ICD-10-CM

## 2014-05-02 DIAGNOSIS — R531 Weakness: Secondary | ICD-10-CM

## 2014-05-05 ENCOUNTER — Ambulatory Visit (AMBULATORY_SURGERY_CENTER): Payer: 59 | Admitting: Internal Medicine

## 2014-05-05 ENCOUNTER — Encounter: Payer: Self-pay | Admitting: Internal Medicine

## 2014-05-05 VITALS — BP 119/72 | HR 67 | Temp 97.9°F | Resp 19 | Ht 69.5 in | Wt 178.0 lb

## 2014-05-05 DIAGNOSIS — Z1211 Encounter for screening for malignant neoplasm of colon: Secondary | ICD-10-CM

## 2014-05-05 MED ORDER — SODIUM CHLORIDE 0.9 % IV SOLN: 500.0000 mL | INTRAVENOUS | Status: DC
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
## 2014-05-05 NOTE — Progress Notes (Signed)
Report to PACU, RN, vss, BBS= Clear.  

## 2014-05-05 NOTE — Patient Instructions (Signed)
Discharge instructions given. Handouts on diverticulosis and a high fiber diet. Resume previous medications. YOU HAD AN ENDOSCOPIC PROCEDURE TODAY AT THE [facility]: Refer to the procedure report that was given to you for any specific questions about what was found during the examination.  If the procedure report does not answer your questions, please call your gastroenterologist to clarify.  If you requested that your care partner not be given the details of your procedure findings, then the procedure report has been included in a sealed envelope for you to review at your convenience later.  YOU SHOULD EXPECT: Some feelings of bloating in the abdomen. Passage of more gas than usual.  Walking can help get rid of the air that was put into your GI tract during the procedure and reduce the bloating. If you had a lower endoscopy (such as a colonoscopy or flexible sigmoidoscopy) you may notice spotting of blood in your stool or on the toilet paper. If you underwent a bowel prep for your procedure, then you may not have a normal bowel movement for a few days.  DIET: Your first meal following the procedure should be a light meal and then it is ok to progress to your normal diet.  A half-sandwich or bowl of soup is an example of a good first meal.  Heavy or fried foods are harder to digest and may make you feel nauseous or bloated.  Likewise meals heavy in dairy and vegetables can cause extra gas to form and this can also increase the bloating.  Drink plenty of fluids but you should avoid alcoholic beverages for 24 hours.  ACTIVITY: Your care partner should take you home directly after the procedure.  You should plan to take it easy, moving slowly for the rest of the day.  You can resume normal activity the day after the procedure however you should NOT DRIVE or use heavy machinery for 24 hours (because of the sedation medicines used during the test).    SYMPTOMS TO REPORT IMMEDIATELY: A  gastroenterologist can be reached at any hour.  During normal business hours, 8:30 AM to 5:00 PM Monday through Friday, call [phone].  After hours and on weekends, please call the GI answering service at [phone] who will take a message and have the physician on call contact you.   Following lower endoscopy (colonoscopy or flexible sigmoidoscopy):  Excessive amounts of blood in the stool  Significant tenderness or worsening of abdominal pains  Swelling of the abdomen that is new, acute  Fever of 100F or higher  FOLLOW UP: If any biopsies were taken you will be contacted by phone or by letter within the next 1-3 weeks.  Call your gastroenterologist if you have not heard about the biopsies in 3 weeks.  Our staff will call the home number listed on your records the next business day following your procedure to check on you and address any questions or concerns that you may have at that time regarding the information given to you following your procedure. This is a courtesy call and so if there is no answer at the home number and we have not heard from you through the emergency physician on call, we will assume that you have returned to your regular daily activities without incident.  SIGNATURES/CONFIDENTIALITY: You and/or your care partner have signed paperwork which will be entered into your electronic medical record.  These signatures attest to the fact that that the information above on your After Visit Summary   has been reviewed and is understood.  Full responsibility of the confidentiality of this discharge information lies with you and/or your care-partner. 

## 2014-05-05 NOTE — Patient Instructions (Signed)
Verbally discussed d/c use of wrist splint/brace on weekend as pt reports that he uses when not at work to keep wrist still and in neutral position. Discussed verbally w/ pt that this may be hindering progress and strength as he is in protected position over the weekend when not at work and then goes back to work and uses LUE for work tasks. Pt reports that Mondays and Tuesdays he usually experiences increased symptoms/pain. He verbalized understanding of weaning from and d/cing use as able to increase strength and functional use.

## 2014-05-06 ENCOUNTER — Ambulatory Visit: Payer: 59 | Admitting: Occupational Therapy

## 2014-05-06 ENCOUNTER — Encounter: Payer: Self-pay | Admitting: Occupational Therapy

## 2014-05-06 ENCOUNTER — Telehealth: Payer: Self-pay

## 2014-05-06 DIAGNOSIS — R531 Weakness: Secondary | ICD-10-CM

## 2014-05-06 DIAGNOSIS — M25532 Pain in left wrist: Secondary | ICD-10-CM | POA: Diagnosis not present

## 2014-05-06 DIAGNOSIS — M2560 Stiffness of unspecified joint, not elsewhere classified: Secondary | ICD-10-CM

## 2014-05-06 DIAGNOSIS — G8929 Other chronic pain: Secondary | ICD-10-CM
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
## 2014-05-06 NOTE — Telephone Encounter (Signed)
  Follow up Call-  Call back number 05/05/2014  Post procedure Call Back phone  # [phone]  Permission to leave phone message Yes     Patient questions:  Do you have a fever, pain , or abdominal swelling? No. Pain Score  0 *  Have you tolerated food without any problems? Yes.    Have you been able to return to your normal activities? Yes.    Do you have any questions about your discharge instructions: Diet   No. Medications  No. Follow up visit  No.  Do you have questions or concerns about your Care? No.  Actions: * If pain score is 4 or above: No action needed, pain <4.

## 2014-05-07 NOTE — Op Note (Signed)
[facility] [address] [place], [address]   COLONOSCOPY PROCEDURE REPORT  PATIENT: Jim LauberHenderson, Mardell S  MR#: [id number] BIRTHDATE: Jul 05, 1960 , 53  yrs. old GENDER: male ENDOSCOPIST: Beverley FiedlerJay M Pyrtle, MD REFERRED NG:EXBMWUXBY:Valerie Felicity CoyerLeschber, M.D. PROCEDURE DATE:  05/05/2014 PROCEDURE:   Colonoscopy, screening First Screening Colonoscopy - Avg.  risk and is 50 yrs.  old or older Yes.  Prior Negative Screening - Now for repeat screening. N/A  History of Adenoma - Now for follow-up colonoscopy & has been > or = to 3 yrs.  N/A  Polyps Removed Today? Yes. ASA CLASS:   Class II INDICATIONS:average risk for colon cancer and first colonoscopy. MEDICATIONS: Monitored anesthesia care and Propofol 200 mg IV  DESCRIPTION OF PROCEDURE:   After the risks benefits and alternatives of the procedure were thoroughly explained, informed consent was obtained.  The digital rectal exam revealed no rectal mass.   The LB [id number]  endoscope was introduced through the anus and advanced to the cecum, which was identified by both the appendix and ileocecal valve. No adverse events experienced. The quality of the prep was good, using MoviPrep  The instrument was then slowly withdrawn as the colon was fully examined.    COLON FINDINGS: There was mild diverticulosis noted at the cecum, in the ascending colon, and at the hepatic flexure.   The examination was otherwise normal with no polyps or cancers seen.  Retroflexed views revealed no abnormalities. The time to cecum=1 minutes 54 seconds.  Withdrawal time=8 minutes 08 seconds.  The scope was withdrawn and the procedure completed.  COMPLICATIONS: There were no immediate complications.  ENDOSCOPIC IMPRESSION: 1.   Mild diverticulosis was noted at the cecum, in the ascending colon, and at the hepatic flexure 2.   The examination was otherwise normal  RECOMMENDATIONS: 1.  High fiber diet 2.  You should continue to follow colorectal  cancer screening guidelines for "routine risk" patients with a repeat colonoscopy in 10 years.  There is no need for FOBT (stool) testing for at least 5 years.  eSigned:  Beverley FiedlerJay M Pyrtle, MD 05/05/2014 10:43 AM   cc: Newt LukesValerie A Leschber, MD and The Patient

## 2014-05-08 ENCOUNTER — Encounter: Payer: Self-pay | Admitting: Occupational Therapy

## 2014-05-08 ENCOUNTER — Ambulatory Visit: Payer: 59 | Admitting: Occupational Therapy

## 2014-05-08 DIAGNOSIS — M25532 Pain in left wrist: Secondary | ICD-10-CM | POA: Diagnosis not present

## 2014-05-08 DIAGNOSIS — G8929 Other chronic pain: Secondary | ICD-10-CM

## 2014-05-08 DIAGNOSIS — R531 Weakness: Secondary | ICD-10-CM

## 2014-05-08 NOTE — Patient Instructions (Signed)
Encouraged patient to alternate heavy work with PROM and stretching to ensure mobility remains with strengthening activities.

## 2014-05-08 NOTE — Therapy (Signed)
[facility] [address] [place], [place], [address] Phone: [phone]   Fax:  [phone]  Occupational Therapy Treatment  Patient Details  Name: Jim LauberJohnny S Jones MRN: [id number] Date of Birth: 02/28/1961  Encounter Date: 05/08/2014      OT End of Session - 05/08/14 1327    Visit Number 6   Number of Visits 17   Date for OT Re-Evaluation 06/11/14   Authorization Type U[employer] 40 visit limit   OT Start Time 0747   OT Stop Time 0830   OT Time Calculation (min) 43 min   Activity Tolerance Patient tolerated treatment well   Behavior During Therapy [facility] for tasks assessed/performed      Past Medical History  Diagnosis Date  . OA (osteoarthritis)   . Low back pain 2009    Hx right side ruptured disc  . CHICKENPOX, HX OF   . MIGRAINE HEADACHE   . RENAL CALCULUS, HX OF 2000    Past Surgical History  Procedure Laterality Date  . Olecranon bursa excision  09/28/11    right side, Ortmann    There were no vitals taken for this visit.  Visit Diagnosis:  Wrist pain, chronic, left  Generalized weakness      Subjective Assessment - 05/08/14 0844    Symptoms I have been trying to not wear the brace.  She told me it would limit me.     Pain Score 7    Pain Location Wrist   Pain Orientation Left   Pain Descriptors / Indicators Aching;Sore   Pain Type Chronic pain   Pain Onset More than a month ago   Pain Frequency Intermittent   Aggravating Factors  work and activity   Pain Relieving Factors Heat, medicine   Effect of Pain on Daily Activities Limits work as Runner, broadcasting/film/videodiesel mecahnic   Multiple Pain Sites No            OT Treatments/Exercises (OP) - 05/08/14 0001    Hand Exercises   MCPJ Flexion AROM;PROM;Left  thumb   Other Hand Exercises Digiflex 3.0 lbs 10 reps x3.  0.7 lbs for individual digit flexion 10 reps x3 difficulty with 5th digit due to pain   Manual Therapy   Manual Therapy Manual Traction  gentle with PROM for  flexion and extension          OT Education - 05/08/14 1326    Education provided Yes   Education Details Balance AROM and strengthening / heavy work   Teacher, musicerson(s) Educated Patient   Methods Explanation;Demonstration   Comprehension Verbalized understanding;Returned demonstration          OT Short Term Goals - 05/08/14 1329    OT SHORT TERM GOAL #1   Title I with inital HEP   Baseline 05/16/14   Time 4   Period Weeks   Status On-going   OT SHORT TERM GOAL #2   Title Pt will increase L thumb MP flexion by 5 for increased functional use.   Baseline 05/16/14   Time 4   Period Weeks   Status On-going   OT SHORT TERM GOAL #3   Title Pt will increase LUE grip strength by 8 lbs for increased funcional use.   Baseline 05/16/14   Time 4   Period Weeks   Status On-going   OT SHORT TERM GOAL #4   Title Pt will report that LUE pain is 5/10 or less during functional activity.   Baseline 12/18/5   Time 4  Period Weeks   Status On-going          OT Long Term Goals - 05/08/14 1329    OT LONG TERM GOAL #1   Title I with updated HEP.   Baseline 06/13/14   Time 8   Period Weeks   Status New   OT LONG TERM GOAL #2   Title Pt will demonstrate LUE grip strength of at least 45 lbs for increased functional use.   Baseline 06/13/14   Time 8   Period Weeks   Status New   OT LONG TERM GOAL #3   Title Increase L tip pinch and lateral pinch by 8 lbs from inital measurement.   Baseline 06/13/14   Time 4   Period Weeks   Status New   OT LONG TERM GOAL #4   Title Pt will report that he is using LUE as non dominant assist at least 90% x for work and yard work activities with pain less than or equal to 3/10.   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 Scaphoid fracture of wrist 12/23/2013  . DDD (degenerative disc disease), lumbar   . MIGRAINE HEADACHE 12/07/2009  . OSTEOARTHRITIS 12/07/2009  . RENAL CALCULUS, HX OF 12/07/2009   Bretta BangKris Gellert, OTR/L 05/08/2014 1:34 PM Phone: [phone] Fax: [phone]   Collier SalinaGellert, Jim Jones 05/08/2014, 1:32 PM

## 2014-05-09 DIAGNOSIS — Z0279 Encounter for issue of other medical certificate: Secondary | ICD-10-CM

## 2014-05-13 ENCOUNTER — Ambulatory Visit: Payer: 59 | Admitting: Occupational Therapy

## 2014-05-15 ENCOUNTER — Ambulatory Visit: Payer: 59 | Admitting: Occupational Therapy

## 2014-05-15 ENCOUNTER — Encounter: Payer: Self-pay | Admitting: Occupational Therapy

## 2014-05-15 DIAGNOSIS — M2560 Stiffness of unspecified joint, not elsewhere classified: Secondary | ICD-10-CM

## 2014-05-15 DIAGNOSIS — G8929 Other chronic pain: Secondary | ICD-10-CM

## 2014-05-15 DIAGNOSIS — R531 Weakness: Secondary | ICD-10-CM

## 2014-05-15 DIAGNOSIS — M25532 Pain in left wrist: Principal | ICD-10-CM

## 2014-05-15 NOTE — Patient Instructions (Signed)
AROM: Thumb Flexion / Extension   Actively bend left thumb across palm as far as possible. Hold __5__ seconds. Relax. Then pull thumb back into hitchhike position. Repeat _15-20___ times per set. Do _6___ sessions per day.  Copyright  VHI. All rights reserved.

## 2014-05-15 NOTE — Therapy (Signed)
[facility] [address] [place], [place], [address] Phone: [phone]   Fax:  [phone]  Occupational Therapy Treatment  Patient Details  Name: Jim Jones MRN: [id number] Date of Birth: [birth year]  Encounter Date: 05/15/2014      OT End of Session - 05/15/14 0848    Visit Number 7   Number of Visits 17   Date for OT Re-Evaluation 06/11/14   Authorization Type UHC 40 visit limit   OT Start Time 0803   OT Stop Time 0846   OT Time Calculation (min) 43 min   Activity Tolerance Patient tolerated treatment well      Past Medical History  Diagnosis Date  . OA (osteoarthritis)   . Low back pain 2009    Hx right side ruptured disc  . CHICKENPOX, HX OF   . MIGRAINE HEADACHE   . RENAL CALCULUS, HX OF 2000    Past Surgical History  Procedure Laterality Date  . Olecranon bursa excision  09/28/11    right side, Ortmann    There were no vitals taken for this visit.  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    Other Hand Exercises Thumb flex/ext and opposition ex's each x 15 reps   LUE Fluidotherapy   Number Minutes Fluidotherapy 12 Minutes   LUE Fluidotherapy Location Wrist;Hand   Comments to decrease stiffness and pain   Manual Therapy   Manual Therapy Passive ROM   Passive ROM Passive thumb flexion and opposition performed by therapist          OT Education - 05/15/14 0841    Education provided Yes   Education Details thumb flexion and extension AROM HEP (Pt also shown resisted thumb flexion exercise w/ putty)   Person(s) Educated Patient   Methods Explanation;Demonstration;Handout   Comprehension Verbalized understanding;Returned demonstration          OT Short Term Goals - 05/15/14 0826    OT SHORT TERM GOAL #1   Title I with inital HEP   Baseline 05/16/14   Time 4   Period Weeks   Status Achieved  on 05/15/14   OT SHORT TERM GOAL #2   Title Pt will increase L thumb MP flexion by 5 for increased functional use.   Baseline 05/16/14   Time 4   Period Weeks   Status Achieved  Currently = 40* on 05/15/14, (eval = 35*)   OT SHORT TERM GOAL #3   Title Pt will increase LUE grip strength by 8 lbs for increased funcional use.   Baseline 05/16/14   Time  4   Period Weeks   Status Achieved  Lt = 50 lbs on 05/15/14 (eval = 31 lbs)   OT SHORT TERM GOAL #4   Title Pt will report that LUE pain is 5/10 or less during functional activity.   Baseline 12/18/5   Time 4   Period Weeks   Status Not Met  Currently 7/10          OT Long Term Goals - 05/15/14 8421    OT LONG TERM GOAL #1   Title I with updated HEP.   Baseline 06/13/14   Time 8   Period Weeks   Status On-going   OT LONG TERM GOAL #2   Title Pt will demonstrate LUE grip strength of at least 60 lbs for increased functional use.   Baseline 06/13/14   Time 8   Period Weeks   Status Revised  originally set for 45 lbs but met on 05/15/14   OT LONG TERM GOAL #3   Title Increase L tip pinch and lateral pinch by 8 lbs from inital measurement.   Baseline 06/13/14  (eval: Lt tip pinch = 8 lbs, Rt = 18 lbs. Lt lateral pinch = 9 lbs, Rt = 29 lbs)   Time 4   Period Weeks   Status On-going    OT LONG TERM GOAL #4   Title Pt will report that he is using LUE as non dominant assist at least 90% x for work and yard work activities with pain less than or equal to 3/10.   Baseline 06/13/14   Time 4   Period Weeks   Status On-going          Plan - 05/15/14 0849    Clinical Impression Statement Pt met STG's #1-3. Pt did not meet STG #4 secondary to pain still 7/10. (Revised LTG #2 secondary to meeting original grip strength goal)   Plan Continue ROM and strength for wrist and thumb, work on tip and lateral pinch                               Problem List Patient Active Problem List   Diagnosis Date Noted  . Scaphoid fracture of wrist 12/23/2013  . DDD (degenerative disc disease), lumbar   . MIGRAINE HEADACHE 12/07/2009  . OSTEOARTHRITIS 12/07/2009  . RENAL CALCULUS, HX OF 12/07/2009    Carey Bullocks 05/15/2014, 8:53 AM   Redmond Baseman, OTR/L 05/15/2014 8:55 AM Phone [phone] FAX [phone]

## 2014-05-19 ENCOUNTER — Ambulatory Visit: Payer: 59 | Admitting: Occupational Therapy

## 2014-05-19 DIAGNOSIS — M25532 Pain in left wrist: Secondary | ICD-10-CM | POA: Diagnosis not present

## 2014-05-19 DIAGNOSIS — M2560 Stiffness of unspecified joint, not elsewhere classified: Secondary | ICD-10-CM

## 2014-05-19 DIAGNOSIS — R531 Weakness: Secondary | ICD-10-CM

## 2014-05-19 DIAGNOSIS — G8929 Other chronic pain: Secondary | ICD-10-CM

## 2014-05-19 NOTE — Therapy (Signed)
[facility] [address] [place], [place], [address] Phone: [phone]   Fax:  [phone]  Occupational Therapy Treatment  Patient Details  Name: Jim Jones MRN: [id number] Date of Birth: December 07, 1960  Encounter Date: 05/19/2014      OT End of Session - 05/19/14 0906    Visit Number 8   Number of Visits 17   Date for OT Re-Evaluation 06/11/14   Authorization Type UHC 40 visit limit   OT Start Time 0848   OT Stop Time 0928   OT Time Calculation (min) 40 min   Activity Tolerance Patient tolerated treatment well   Behavior During Therapy [facility] for tasks assessed/performed      Past Medical History  Diagnosis Date  . OA (osteoarthritis)   . Low back pain 2009    Hx right side ruptured disc  . CHICKENPOX, HX OF   . MIGRAINE HEADACHE   . RENAL CALCULUS, HX OF 2000    Past Surgical History  Procedure Laterality Date  . Olecranon bursa excision  09/28/11    right side, Ortmann    There were no vitals taken for this visit.  Visit Diagnosis:  Wrist pain, chronic, left  Generalized weakness  Decreased range of motion      Subjective Assessment - 05/19/14 0904    Currently in Pain? Yes   Pain Score 5    Pain Location Wrist   Pain Orientation Left   Pain Descriptors / Indicators Aching   Pain Type Chronic pain   Pain Onset More than a month ago   Pain Frequency Intermittent   Aggravating Factors  malpositioning   Pain Relieving Factors heat, medicine   Effect of Pain on Daily Activities Limits work   Multiple Pain Sites No                 OT Treatments/Exercises (OP) - 05/19/14 0001    Exercises   Exercises Wrist;Hand  flexion/ extension   Additional Wrist Exercises   Hand Gripper with Medium Beads Picking up blocks with 35lbs sustained grip strength with min difficulty   Hand Exercises   Other Hand Exercises Gripper set @ 35 lbs resistance to pick up blocks Lt hand w/ min  difficulty and pain.   Graded clothespins-sustained pinch,min v.c.mod difficult.   Other Hand Exercises Thumb flex/ext and opposition ex's each x 15 reps   LUE Fluidotherapy   Number Minutes Fluidotherapy 11 Minutes   LUE Fluidotherapy Location Wrist;Hand   Comments to decrease stiffness/ pain   Manual Therapy   Manual Therapy Passive ROM  wrist flexion/extension, ulnar/ radial dev- gentle traction                  OT Short Term Goals - 05/15/14 0826    OT SHORT TERM GOAL #1   Title I with inital HEP   Baseline 05/16/14   Time 4   Period Weeks   Status Achieved  on 05/15/14   OT SHORT TERM GOAL #2   Title Pt will increase L thumb MP flexion by 5 for increased functional use.   Baseline 05/16/14   Time 4   Period Weeks   Status Achieved  Currently = 40* on 05/15/14, (eval = 35*)   OT SHORT TERM GOAL #3   Title Pt will increase LUE grip strength by 8 lbs for increased funcional use.   Baseline 05/16/14   Time 4   Period Weeks   Status Achieved  Lt =  50 lbs on 05/15/14 (eval = 31 lbs)   OT SHORT TERM GOAL #4   Title Pt will report that LUE pain is 5/10 or less during functional activity.   Baseline 12/18/5   Time 4   Period Weeks   Status Not Met  Currently 7/10           OT Long Term Goals - 05/15/14 5573    OT LONG TERM GOAL #1   Title I with updated HEP.   Baseline 06/13/14   Time 8   Period Weeks   Status On-going   OT LONG TERM GOAL #2   Title Pt will demonstrate LUE grip strength of at least 60 lbs for increased functional use.   Baseline 06/13/14   Time 8   Period Weeks   Status Revised  originally set for 45 lbs but met on 05/15/14   OT LONG TERM GOAL #3   Title Increase L tip pinch and lateral pinch by 8 lbs from inital measurement.   Baseline 06/13/14  (eval: Lt tip pinch = 8 lbs, Rt = 18 lbs. Lt lateral pinch = 9 lbs, Rt = 29 lbs)   Time 4   Period Weeks   Status On-going   OT LONG TERM GOAL #4   Title Pt will report that he is using  LUE as non dominant assist at least 90% x for work and yard work activities with pain less than or equal to 3/10.   Baseline 06/13/14   Time 4   Period Weeks   Status On-going               Plan - 05/19/14 2202    Clinical Impression Statement Pt can benefit from continued OT to address LUE strength and functional use.   Rehab Potential Good   Clinical Impairments Affecting Rehab Potential Pain   OT Frequency 2x / week   OT Duration 8 weeks   OT Treatment/Interventions Self-care/ADL training;Electrical Stimulation;Iontophoresis;Therapeutic exercise;Splinting;Neuromuscular education;Parrafin;Moist Heat;Fluidtherapy;Therapeutic exercises;Patient/family education;Therapeutic activities;Passive range of motion;Manual Therapy;DME and/or AE instruction;Ultrasound;Contrast Bath;Cryotherapy   Plan continue to address strength and ROM, pinch   Consulted and Agree with Plan of Care Patient        Problem List Patient Active Problem List   Diagnosis Date Noted  . Scaphoid fracture of wrist 12/23/2013  . DDD (degenerative disc disease), lumbar   . MIGRAINE HEADACHE 12/07/2009  . OSTEOARTHRITIS 12/07/2009  . RENAL CALCULUS, HX OF 12/07/2009    RINE,KATHRYN 05/19/2014, 9:33 AM  Theone Murdoch, OTR/L [phone] Phone: [phone] 9:33 AM 05/19/2014  [facility] [address] [place], [place], [address] Phone: [phone]   Fax:  [phone]

## 2014-05-21 ENCOUNTER — Ambulatory Visit: Payer: 59 | Admitting: Occupational Therapy

## 2014-05-21 DIAGNOSIS — G8929 Other chronic pain: Secondary | ICD-10-CM

## 2014-05-21 DIAGNOSIS — M25532 Pain in left wrist: Secondary | ICD-10-CM | POA: Diagnosis not present

## 2014-05-21 DIAGNOSIS — R531 Weakness: Secondary | ICD-10-CM

## 2014-05-21 DIAGNOSIS — M2560 Stiffness of unspecified joint, not elsewhere classified: Secondary | ICD-10-CM

## 2014-05-21 NOTE — Patient Instructions (Addendum)
Hold a 1 lb weight or water bottle in left hand, lift and lower wrist 20 reps 1x day. Hold arm sideways, with water bottle in hand(thumb up) raise and lower wrist side to side(ulnar/ radial deviation) 10-20 reps 1x day

## 2014-05-21 NOTE — Therapy (Signed)
[facility] [address] [place], [place], [address] Phone: [phone]   Fax:  [phone]  Occupational Therapy Treatment  Patient Details  Name: Jim Jones MRN: [id number] Date of Birth: 11/17/1960  Encounter Date: 05/21/2014      OT End of Session - 05/21/14 0933    Visit Number 9   Number of Visits 17   Date for OT Re-Evaluation 06/11/14   Authorization Type UHC 40 visit limit   OT Start Time 0851   OT Stop Time 0930   OT Time Calculation (min) 39 min   Activity Tolerance Patient tolerated treatment well   Behavior During Therapy [facility] for tasks assessed/performed      Past Medical History  Diagnosis Date  . OA (osteoarthritis)   . Low back pain 2009    Hx right side ruptured disc  . CHICKENPOX, HX OF   . MIGRAINE HEADACHE   . RENAL CALCULUS, HX OF 2000    Past Surgical History  Procedure Laterality Date  . Olecranon bursa excision  09/28/11    right side, Ortmann    There were no vitals taken for this visit.  Visit Diagnosis:  Wrist pain, chronic, left  Generalized weakness  Decreased range of motion  Wrist pain, acute, left      Subjective Assessment - 05/21/14 0910    Currently in Pain? Yes   Pain Score 5    Pain Location Wrist   Pain Orientation Left   Pain Descriptors / Indicators Aching   Pain Type Chronic pain   Pain Onset More than a month ago   Pain Frequency Intermittent   Aggravating Factors  malpositioning   Pain Relieving Factors heat, medicine                 OT Treatments/Exercises (OP) - 05/21/14 0001    Exercises   Exercises Wrist;Hand   Wrist Exercises   Wrist Flexion Strengthening;10 reps  with 1 lbs   Wrist Extension Strengthening;10 reps  with 1 lbs   Hand Exercises   MCPJ Flexion AROM;10 reps   Other Hand Exercises Graded clothespins for increase sustained pinch, min difficulty   Other Hand Exercises Thumb flex/ext ex's each x 15 reps    LUE Fluidotherapy   Number Minutes Fluidotherapy 10 Minutes   LUE Fluidotherapy Location Hand;Wrist   Comments to decrease stiffness and pain   Manual Therapy   Manual Therapy Passive ROM   Passive ROM wrist flexion/ extension                OT Education - 05/21/14 0924    Education provided Yes   Education Details weighted wrist flexion/ extension, ulnar /radial deviation   Person(s) Educated Patient   Methods Explanation;Demonstration;Handout   Comprehension Verbalized understanding;Returned demonstration          OT Short Term Goals - 05/15/14 0826    OT SHORT TERM GOAL #1   Title I with inital HEP   Baseline 05/16/14   Time 4   Period Weeks   Status Achieved  on 05/15/14   OT SHORT TERM GOAL #2   Title Pt will increase L thumb MP flexion by 5 for increased functional use.   Baseline 05/16/14   Time 4   Period Weeks   Status Achieved  Currently = 40* on 05/15/14, (eval = 35*)   OT SHORT TERM GOAL #3   Title Pt will increase LUE grip strength by 8 lbs for increased funcional use.  Baseline 05/16/14   Time 4   Period Weeks   Status Achieved  Lt = 50 lbs on 05/15/14 (eval = 31 lbs)   OT SHORT TERM GOAL #4   Title Pt will report that LUE pain is 5/10 or less during functional activity.   Baseline 12/18/5   Time 4   Period Weeks   Status Not Met  Currently 7/10           OT Long Term Goals - 05/15/14 8185    OT LONG TERM GOAL #1   Title I with updated HEP.   Baseline 06/13/14   Time 8   Period Weeks   Status On-going   OT LONG TERM GOAL #2   Title Pt will demonstrate LUE grip strength of at least 60 lbs for increased functional use.   Baseline 06/13/14   Time 8   Period Weeks   Status Revised  originally set for 45 lbs but met on 05/15/14   OT LONG TERM GOAL #3   Title Increase L tip pinch and lateral pinch by 8 lbs from inital measurement.   Baseline 06/13/14  (eval: Lt tip pinch = 8 lbs, Rt = 18 lbs. Lt lateral pinch = 9 lbs, Rt = 29 lbs)    Time 4   Period Weeks   Status On-going   OT LONG TERM GOAL #4   Title Pt will report that he is using LUE as non dominant assist at least 90% x for work and yard work activities with pain less than or equal to 3/10.   Baseline 06/13/14   Time 4   Period Weeks   Status On-going               Plan - 05/21/14 0935    Clinical Impression Statement Pt is progressing towards goals for strength and A/ROM   Rehab Potential Good   Clinical Impairments Affecting Rehab Potential pain   OT Treatment/Interventions Self-care/ADL training;Electrical Stimulation;Iontophoresis;Therapeutic exercise;Splinting;Neuromuscular education;Parrafin;Moist Heat;Fluidtherapy;Therapeutic exercises;Patient/family education;Therapeutic activities;Passive range of motion;Manual Therapy;DME and/or AE instruction;Ultrasound;Contrast Bath;Cryotherapy   Plan reinforce updated HEP, continue with strength and ROM   OT Home Exercise Plan weighted wrist flexion/ extension, ulnar radial deviation        Problem List Patient Active Problem List   Diagnosis Date Noted  . Scaphoid fracture of wrist 12/23/2013  . DDD (degenerative disc disease), lumbar   . MIGRAINE HEADACHE 12/07/2009  . OSTEOARTHRITIS 12/07/2009  . RENAL CALCULUS, HX OF 12/07/2009   Theone Murdoch, OTR/L [phone] Phone: [phone] 6:44 PM 05/21/2014 Azrael Maddix 05/21/2014, 6:43 PM  [facility] [address] [place], [place], [address] Phone: [phone]   Fax:  [phone]

## 2014-05-27 ENCOUNTER — Ambulatory Visit: Payer: 59 | Admitting: Occupational Therapy

## 2014-05-27 DIAGNOSIS — G8929 Other chronic pain: Secondary | ICD-10-CM

## 2014-05-27 DIAGNOSIS — M25532 Pain in left wrist: Secondary | ICD-10-CM | POA: Diagnosis not present

## 2014-05-27 DIAGNOSIS — M2560 Stiffness of unspecified joint, not elsewhere classified: Secondary | ICD-10-CM

## 2014-05-27 DIAGNOSIS — R531 Weakness: Secondary | ICD-10-CM

## 2014-05-27 NOTE — Therapy (Signed)
[facility] [address] [place], [place], [address] Phone: [phone]   Fax:  [phone]  Occupational Therapy Treatment  Patient Details  Name: Jim Jones SPICKLER MRN: [id number] Date of Birth: Jun 15, 1960  Encounter Date: 05/27/2014      OT End of Session - 05/27/14 0925    Visit Number 10   Number of Visits 17   Date for OT Re-Evaluation 06/11/13   Authorization Type 40   OT Start Time 0850   OT Stop Time 0930   OT Time Calculation (min) 40 min   Activity Tolerance Patient tolerated treatment well   Behavior During Therapy [facility] for tasks assessed/performed      Past Medical History  Diagnosis Date  . OA (osteoarthritis)   . Low back pain 2009    Hx right side ruptured disc  . CHICKENPOX, HX OF   . MIGRAINE HEADACHE   . RENAL CALCULUS, HX OF 2000    Past Surgical History  Procedure Laterality Date  . Olecranon bursa excision  09/28/11    right side, Ortmann    There were no vitals taken for this visit.  Visit Diagnosis:  Wrist pain, chronic, left  Generalized weakness  Decreased range of motion  Wrist pain, acute, left      Subjective Assessment - 05/27/14 0905    Currently in Pain? Yes   Pain Score 4    Pain Location Wrist   Pain Orientation Left   Pain Descriptors / Indicators Aching   Pain Type Chronic pain   Pain Onset More than a month ago   Pain Frequency Intermittent   Aggravating Factors  malpositioning   Pain Relieving Factors heat, medicine   Effect of Pain on Daily Activities limits work   Multiple Pain Sites No                 OT Treatments/Exercises (OP) - 05/27/14 0001    Exercises   Exercises Wrist  AROM wrist flexion/ extension   Wrist Exercises   Wrist Flexion Strengthening  15 reps with 1 lbs weight   Wrist Extension Strengthening  15 reps with 1 lbs    Wrist Radial Deviation Strengthening  15 reps with 1lbs weight   Wrist Ulnar Deviation  Strengthening;15 reps  with 1 lbs   Other wrist exercises forearm supination/ pronation with hammer, followed by velcro board for resisted sup/ pronation.   Hand Exercises   Other Hand Exercises Graded clothespins for increase sustained pinch, min difficulty   LUE Fluidotherapy   Number Minutes Fluidotherapy 11 Minutes   LUE Fluidotherapy Location Hand;Wrist   Comments to decrease pain and stiffness   Manual Therapy   Manual Therapy Passive ROM   Passive ROM wrist flexion/ extension and thumb flexion/ extension                  OT Short Term Goals - 05/15/14 0826    OT SHORT TERM GOAL #1   Title I with inital HEP   Baseline 05/16/14   Time 4   Period Weeks   Status Achieved  on 05/15/14   OT SHORT TERM GOAL #2   Title Pt will increase L thumb MP flexion by 5 for increased functional use.   Baseline 05/16/14   Time 4   Period Weeks   Status Achieved  Currently = 40* on 05/15/14, (eval = 35*)   OT SHORT TERM GOAL #3   Title Pt will increase LUE grip strength by 8  lbs for increased funcional use.   Baseline 05/16/14   Time 4   Period Weeks   Status Achieved  Lt = 50 lbs on 05/15/14 (eval = 31 lbs)   OT SHORT TERM GOAL #4   Title Pt will report that LUE pain is 5/10 or less during functional activity.   Baseline 12/18/5   Time 4   Period Weeks   Status Not Met  Currently 7/10           OT Long Term Goals - 05/15/14 7290    OT LONG TERM GOAL #1   Title I with updated HEP.   Baseline 06/13/14   Time 8   Period Weeks   Status On-going   OT LONG TERM GOAL #2   Title Pt will demonstrate LUE grip strength of at least 60 lbs for increased functional use.   Baseline 06/13/14   Time 8   Period Weeks   Status Revised  originally set for 45 lbs but met on 05/15/14   OT LONG TERM GOAL #3   Title Increase L tip pinch and lateral pinch by 8 lbs from inital measurement.   Baseline 06/13/14  (eval: Lt tip pinch = 8 lbs, Rt = 18 lbs. Lt lateral pinch = 9 lbs, Rt =  29 lbs)   Time 4   Period Weeks   Status On-going   OT LONG TERM GOAL #4   Title Pt will report that he is using LUE as non dominant assist at least 90% x for work and yard work activities with pain less than or equal to 3/10.   Baseline 06/13/14   Time 4   Period Weeks   Status On-going               Plan - 05/27/14 1432    Clinical Impression Statement Pt continues to progress towards goals for strength and A/ROM.   Rehab Potential Good   Clinical Impairments Affecting Rehab Potential pain   Plan strength, ROM   OT Home Exercise Plan weighted wrist flexion/ extension, ulnar radial deviation   Consulted and Agree with Plan of Care Patient        Problem List Patient Active Problem List   Diagnosis Date Noted  . Scaphoid fracture of wrist 12/23/2013  . DDD (degenerative disc disease), lumbar   . MIGRAINE HEADACHE 12/07/2009  . OSTEOARTHRITIS 12/07/2009  . RENAL CALCULUS, HX OF 12/07/2009    RINE,KATHRYN 05/27/2014, 2:37 PM Theone Murdoch, OTR/L [phone] Phone: [phone] 2:37 PM 05/27/2014 [facility] [address] [place], [place], [address] Phone: [phone]   Fax:  [phone]

## 2014-05-29 ENCOUNTER — Ambulatory Visit: Payer: 59 | Admitting: Occupational Therapy

## 2014-05-29 DIAGNOSIS — R531 Weakness: Secondary | ICD-10-CM

## 2014-05-29 DIAGNOSIS — G8929 Other chronic pain: Secondary | ICD-10-CM

## 2014-05-29 DIAGNOSIS — M25532 Pain in left wrist: Secondary | ICD-10-CM | POA: Diagnosis not present

## 2014-05-29 DIAGNOSIS — M2560 Stiffness of unspecified joint, not elsewhere classified: Secondary | ICD-10-CM

## 2014-05-29 NOTE — Patient Instructions (Signed)
Hold a lightweight hammer in right hand, rotate forearm with palm up then palm down 10-20 reps 1x day. Stop if pain increases.

## 2014-05-29 NOTE — Therapy (Signed)
[place] [place] [address] [place], [place], [address] Phone: [phone]   Fax:  [phone]  Occupational Therapy Treatment  Patient Details  Name: Jim Jones MRN: [id number] Date of Birth: 09-Nov-1960  Encounter Date: 05/29/2014      OT End of Session - 05/29/14 0854    Visit Number 11   Number of Visits 17   Date for OT Re-Evaluation 06/11/13   Authorization Type 40   OT Start Time 0847   OT Stop Time 0930   OT Time Calculation (min) 43 min   Activity Tolerance Patient tolerated treatment well   Behavior During Therapy [facility] for tasks assessed/performed      Past Medical History  Diagnosis Date  . OA (osteoarthritis)   . Low back pain 2009    Hx right side ruptured disc  . CHICKENPOX, HX OF   . MIGRAINE HEADACHE   . RENAL CALCULUS, HX OF 2000    Past Surgical History  Procedure Laterality Date  . Olecranon bursa excision  09/28/11    right side, Ortmann    There were no vitals taken for this visit.  Visit Diagnosis:  Wrist pain, chronic, left  Generalized weakness  Decreased range of motion  Weakness      Subjective Assessment - 05/29/14 0853    Pain Score 3    Pain Location Wrist   Pain Type Chronic pain   Pain Onset More than a month ago   Pain Frequency Intermittent   Aggravating Factors  malpositioning   Pain Relieving Factors heat, medicine   Effect of Pain on Daily Activities limits work activities   Multiple Pain Sites No                 OT Treatments/Exercises (OP) - 05/29/14 0001    Wrist Exercises   Other wrist exercises forearm supination/ pronation with hammer,    Hand Exercises   Other Hand Exercises Gripper set at 55 lbs to pick up 1 in blocks, min difficulty and 1 rest break   Other Hand Exercises Thumb flex/ext ex's each x 15 reps   Neurological Re-education Exercises   Wrist Flexion Strengthening  20 reps with 1 lbs   Wrist Extension Strengthening   20 reps with 1 lbs   Wrist Radial Deviation Strengthening   20 reps with 1 lbs   Wrist Ulnar Deviation Strengthening  20 reps with 1 lbs   LUE Fluidotherapy   Number Minutes Fluidotherapy 11 Minutes   LUE Fluidotherapy Location Hand;Wrist   Comments to decrease pain and stiffness   Manual Therapy   Manual Therapy Passive ROM;Manual Traction  gentle P/ROM wrist flexion/ extension                  OT Short Term Goals - 05/15/14 0826    OT SHORT TERM GOAL #1   Title I with inital HEP   Baseline 05/16/14   Time 4   Period Weeks   Status Achieved  on 05/15/14   OT SHORT TERM GOAL #2   Title Pt will increase L thumb MP flexion by 5 for increased functional use.   Baseline 05/16/14   Time 4   Period Weeks   Status Achieved  Currently = 40* on 05/15/14, (eval = 35*)   OT SHORT TERM GOAL #3   Title Pt will increase LUE grip strength by 8 lbs for increased funcional use.   Baseline 05/16/14   Time 4   Period Weeks  Status Achieved  Lt = 50 lbs on 05/15/14 (eval = 31 lbs)   OT SHORT TERM GOAL #4   Title Pt will report that LUE pain is 5/10 or less during functional activity.   Baseline 12/18/5   Time 4   Period Weeks   Status Not Met  Currently 7/10           OT Long Term Goals - 05/15/14 2440    OT LONG TERM GOAL #1   Title I with updated HEP.   Baseline 06/13/14   Time 8   Period Weeks   Status On-going   OT LONG TERM GOAL #2   Title Pt will demonstrate LUE grip strength of at least 60 lbs for increased functional use.   Baseline 06/13/14   Time 8   Period Weeks   Status Revised  originally set for 45 lbs but met on 05/15/14   OT LONG TERM GOAL #3   Title Increase L tip pinch and lateral pinch by 8 lbs from inital measurement.   Baseline 06/13/14  (eval: Lt tip pinch = 8 lbs, Rt = 18 lbs. Lt lateral pinch = 9 lbs, Rt = 29 lbs)   Time 4   Period Weeks   Status On-going   OT LONG TERM GOAL #4   Title Pt will report that he is using LUE as non  dominant assist at least 90% x for work and yard work activities with pain less than or equal to 3/10.   Baseline 06/13/14   Time 4   Period Weeks   Status On-going               Plan - 05/29/14 0920    Clinical Impression Statement Pt is progressing with increased strength and A/ROM.   Rehab Potential Good   Clinical Impairments Affecting Rehab Potential pain   OT Frequency 2x / week   OT Duration 8 weeks   OT Treatment/Interventions Self-care/ADL training;Electrical Stimulation;Iontophoresis;Therapeutic exercise;Splinting;Neuromuscular education;Parrafin;Moist Heat;Fluidtherapy;Therapeutic exercises;Patient/family education;Therapeutic activities;Passive range of motion;Manual Therapy;DME and/or AE instruction;Ultrasound;Contrast Bath;Cryotherapy   Plan strength/ ROM   OT Home Exercise Plan supination/ pronation with a hammer   Consulted and Agree with Plan of Care Patient        Problem List Patient Active Problem List   Diagnosis Date Noted  . Scaphoid fracture of wrist 12/23/2013  . DDD (degenerative disc disease), lumbar   . MIGRAINE HEADACHE 12/07/2009  . OSTEOARTHRITIS 12/07/2009  . RENAL CALCULUS, HX OF 12/07/2009    RINE,KATHRYN 05/29/2014, 9:50 AM  Theone Murdoch, OTR/L [phone] Phone: [phone] 9:50 AM 05/29/2014  [facility] [address] [place], [place], [address] Phone: [phone]   Fax:  [phone]

## 2014-06-03 ENCOUNTER — Ambulatory Visit: Payer: 59 | Attending: Internal Medicine | Admitting: Occupational Therapy

## 2014-06-03 ENCOUNTER — Encounter: Payer: Self-pay | Admitting: Occupational Therapy

## 2014-06-03 DIAGNOSIS — R531 Weakness: Secondary | ICD-10-CM | POA: Diagnosis not present

## 2014-06-03 DIAGNOSIS — M25532 Pain in left wrist: Secondary | ICD-10-CM | POA: Insufficient documentation

## 2014-06-03 DIAGNOSIS — G8929 Other chronic pain: Secondary | ICD-10-CM

## 2014-06-03 DIAGNOSIS — M2560 Stiffness of unspecified joint, not elsewhere classified: Secondary | ICD-10-CM

## 2014-06-03 NOTE — Therapy (Signed)
[facility] [address] [place], [place], [address] Phone: [phone]   Fax:  [phone]  Occupational Therapy Treatment  Patient Details  Name: Jim Jones MRN: [id number] Date of Birth: 08/09/1960  Encounter Date: 06/03/2014      OT End of Session - 06/03/14 0854    Visit Number 12   Number of Visits 17   Date for OT Re-Evaluation 06/11/13   Authorization Type [employer] 40 visit limit combined   OT Start Time 0833   OT Stop Time 0913   OT Time Calculation (min) 40 min   Activity Tolerance Patient tolerated treatment well      Past Medical History  Diagnosis Date  . OA (osteoarthritis)   . Low back pain 2009    Hx right side ruptured disc  . CHICKENPOX, HX OF   . MIGRAINE HEADACHE   . RENAL CALCULUS, HX OF 2000    Past Surgical History  Procedure Laterality Date  . Olecranon bursa excision  09/28/11    right side, Ortmann    There were no vitals taken for this visit.  Visit Diagnosis:  Generalized weakness  Wrist pain, chronic, left  Decreased range of motion      Subjective Assessment - 06/03/14 0839    Symptoms "It's a lot better."   Currently in Pain? No/denies  at beginning of session   Pain Score --  0-3/10   Pain Location Wrist   Pain Orientation Left   Aggravating Factors  pulling at work   Pain Relieving Factors heat, medicine                 OT Treatments/Exercises (OP) - 06/03/14 0001    Exercises   Exercises Wrist   Wrist Exercises   Bar Weights/Barbell (Forearm Supination) 2 lbs  x10, with hammer x15   Bar Weights/Barbell (Forearm Pronation) 2 lbs  x10, with hammer x15   Bar Weights/Barbell (Wrist Flexion) 1 lb;2 lbs  1lbx 20, 2lbs x10   Bar Weights/Barbell (Wrist Extension) 2 lbs;1 lb  1lb x20, 2lbs, x10   Bar Weights/Barbell (Radial Deviation) 1 lb;2 lbs  1lbx 20, 2lbs x10   Bar Weights/Barbell (Ulnar Deviation) 1 lb;2 lbs  1lb x20, 2lbs x10   Additional Wrist Exercises   Hand Gripper with Medium Beads Picking up blocks with 55lbs sustained grip strength with min difficulty   Hand Exercises   Other Hand Exercises Thumb flex/ext x15   LUE Fluidotherapy   Number Minutes Fluidotherapy 10 Minutes   LUE Fluidotherapy Location Wrist;Hand   Comments --  with no adverse reactions for stiffness   Manual Therapy   Passive ROM wrist flex/ext performed by therapist                  OT Short Term Goals - 06/03/14 4327    OT SHORT TERM GOAL #1   Title I with inital HEP   Status Achieved   OT SHORT TERM GOAL #2   Title Pt will increase L thumb MP flexion by 5 for increased functional use.   Status Achieved   OT SHORT TERM GOAL #3   Title Pt will increase LUE grip strength by 8 lbs for increased funcional use.   Status Achieved   OT SHORT TERM GOAL #4   Title Pt will report that LUE pain is 5/10 or less during functional activity.   Status Achieved  3/10 per pt  OT Long Term Goals - 05/15/14 7195    OT LONG TERM GOAL #1   Title I with updated HEP.   Baseline 06/13/14   Time 8   Period Weeks   Status On-going   OT LONG TERM GOAL #2   Title Pt will demonstrate LUE grip strength of at least 60 lbs for increased functional use.   Baseline 06/13/14   Time 8   Period Weeks   Status Revised  originally set for 45 lbs but met on 05/15/14   OT LONG TERM GOAL #3   Title Increase L tip pinch and lateral pinch by 8 lbs from inital measurement.   Baseline 06/13/14  (eval: Lt tip pinch = 8 lbs, Rt = 18 lbs. Lt lateral pinch = 9 lbs, Rt = 29 lbs)   Time 4   Period Weeks   Status On-going   OT LONG TERM GOAL #4   Title Pt will report that he is using LUE as non dominant assist at least 90% x for work and yard work activities with pain less than or equal to 3/10.   Baseline 06/13/14   Time 4   Period Weeks   Status On-going               Plan - 06/03/14 0855    Clinical Impression Statement Pt continues  to progress with decreased pain, improved ROM and strength.   Plan strength, ROM        Problem List Patient Active Problem List   Diagnosis Date Noted  . Scaphoid fracture of wrist 12/23/2013  . DDD (degenerative disc disease), lumbar   . MIGRAINE HEADACHE 12/07/2009  . OSTEOARTHRITIS 12/07/2009  . RENAL CALCULUS, HX OF 12/07/2009    Flower Franko, OTR/L 06/03/2014, 9:13 AM  [facility] [address] [place], [place], [address] Phone: [phone]   Fax:  [phone]

## 2014-06-05 ENCOUNTER — Ambulatory Visit: Payer: 59 | Admitting: Occupational Therapy

## 2014-06-05 DIAGNOSIS — M2560 Stiffness of unspecified joint, not elsewhere classified: Secondary | ICD-10-CM

## 2014-06-05 DIAGNOSIS — M25532 Pain in left wrist: Secondary | ICD-10-CM

## 2014-06-05 DIAGNOSIS — G8929 Other chronic pain: Secondary | ICD-10-CM

## 2014-06-05 DIAGNOSIS — R531 Weakness: Secondary | ICD-10-CM

## 2014-06-05 NOTE — Therapy (Signed)
[facility] [address] [place], [place], [address] Phone: [phone]   Fax:  [phone]  Occupational Therapy Treatment  Patient Details  Name: Jim Jones MRN: [id number] Date of Birth: 01/31/1961 Referring Provider:  Leschber, Valerie A, MD  Encounter Date: 06/05/2014      OT End of Session - 06/05/14 0924    Visit Number 13   Number of Visits 17   Date for OT Re-Evaluation 06/13/13   Authorization Type [employer] 40 visit limit combined   OT Start Time 0848   OT Stop Time 0928   OT Time Calculation (min) 40 min   Activity Tolerance Patient tolerated treatment well   Behavior During Therapy [facility] for tasks assessed/performed      Past Medical History  Diagnosis Date  . OA (osteoarthritis)   . Low back pain 2009    Hx right side ruptured disc  . CHICKENPOX, HX OF   . MIGRAINE HEADACHE   . RENAL CALCULUS, HX OF 2000    Past Surgical History  Procedure Laterality Date  . Olecranon bursa excision  09/28/11    right side, Ortmann    There were no vitals taken for this visit.  Visit Diagnosis:  Generalized weakness  Wrist pain, chronic, left  Decreased range of motion  Weakness      Subjective Assessment - 06/05/14 0904    Currently in Pain? Yes   Pain Score 3    Pain Location Wrist   Pain Orientation Left   Pain Descriptors / Indicators Aching   Pain Type Chronic pain   Pain Onset More than a month ago   Pain Frequency Intermittent   Aggravating Factors  pulling at work   Pain Relieving Factors heat, meds                 OT Treatments/Exercises (OP) - 06/05/14 0001    Exercises   Exercises Wrist   Wrist Exercises   Wrist Flexion Strengthening;20 reps   Bar Weights/Barbell (Wrist Flexion) 3 lbs   Wrist Extension Strengthening   Bar Weights/Barbell (Wrist Extension) 3 lbs   Wrist Radial Deviation Strengthening;20 reps   Bar Weights/Barbell (Radial Deviation) 3 lbs   Wrist  Ulnar Deviation Strengthening   Bar Weights/Barbell (Ulnar Deviation) 3 lbs   Other wrist exercises forearm supination/ pronation with hammer,    Additional Wrist Exercises   Theraputty Roll;Grip;Pinch  therapist upgraded to green  putty, 10-20 reps each   Hand Exercises   Other Hand Exercises Gripper set at 55 lbs to picjk up 1 inch blocks, min difficulty   Other Hand Exercises Graded clothespins for sustained pinch   LUE Fluidotherapy   Number Minutes Fluidotherapy 11 Minutes   LUE Fluidotherapy Location Wrist;Hand   Comments for  stiffness, no adverse reactions                OT Education - 06/05/14 0923    Education provided Yes   Education Details green putty, upgraded HEP.   Person(s) Educated Patient   Methods Explanation;Demonstration   Comprehension Verbalized understanding;Returned demonstration          OT Short Term Goals - 06/03/14 0856    OT SHORT TERM GOAL #1   Title I with inital HEP   Status Achieved   OT SHORT TERM GOAL #2   Title Pt will increase L thumb MP flexion by 5 for increased functional use.   Status Achieved   OT SHORT TERM GOAL #3     Title Pt will increase LUE grip strength by 8 lbs for increased funcional use.   Status Achieved   OT SHORT TERM GOAL #4   Title Pt will report that LUE pain is 5/10 or less during functional activity.   Status Achieved  3/10 per pt           OT Long Term Goals - 05/15/14 0832    OT LONG TERM GOAL #1   Title I with updated HEP.   Baseline 06/13/14   Time 8   Period Weeks   Status On-going   OT LONG TERM GOAL #2   Title Pt will demonstrate LUE grip strength of at least 60 lbs for increased functional use.   Baseline 06/13/14   Time 8   Period Weeks   Status Revised  originally set for 45 lbs but met on 05/15/14   OT LONG TERM GOAL #3   Title Increase L tip pinch and lateral pinch by 8 lbs from inital measurement.   Baseline 06/13/14  (eval: Lt tip pinch = 8 lbs, Rt = 18 lbs. Lt lateral pinch =  9 lbs, Rt = 29 lbs)   Time 4   Period Weeks   Status On-going   OT LONG TERM GOAL #4   Title Pt will report that he is using LUE as non dominant assist at least 90% x for work and yard work activities with pain less than or equal to 3/10.   Baseline 06/13/14   Time 4   Period Weeks   Status On-going               Plan - 06/05/14 0925    Clinical Impression Statement Pt is progressing towards goals with decreased pain and increased strength.   Rehab Potential Good   Clinical Impairments Affecting Rehab Potential pain   OT Frequency 2x / week   OT Duration 8 weeks   OT Treatment/Interventions Self-care/ADL training;Electrical Stimulation;Iontophoresis;Therapeutic exercise;Splinting;Neuromuscular education;Parrafin;Moist Heat;Fluidtherapy;Therapeutic exercises;Patient/family education;Therapeutic activities;Passive range of motion;Manual Therapy;DME and/or AE instruction;Ultrasound;Contrast Bath;Cryotherapy   Plan check goals week of 06/09/13, anticipate d/c, strength   OT Home Exercise Plan green putty   Consulted and Agree with Plan of Care Patient        Problem List Patient Active Problem List   Diagnosis Date Noted  . Scaphoid fracture of wrist 12/23/2013  . DDD (degenerative disc disease), lumbar   . MIGRAINE HEADACHE 12/07/2009  . OSTEOARTHRITIS 12/07/2009  . RENAL CALCULUS, HX OF 12/07/2009    RINE,KATHRYN 06/05/2014, 5:48 PM Kathryn Rine, OTR/L [phone] Phone: [phone] 5:48 PM 06/05/2014 [facility] [address] [place], [place], [address] Phone: [phone]   Fax:  [phone]    

## 2014-06-10 ENCOUNTER — Ambulatory Visit: Payer: 59 | Admitting: Occupational Therapy

## 2014-06-10 DIAGNOSIS — R531 Weakness: Secondary | ICD-10-CM

## 2014-06-10 DIAGNOSIS — M25532 Pain in left wrist: Secondary | ICD-10-CM | POA: Diagnosis not present

## 2014-06-10 DIAGNOSIS — M2560 Stiffness of unspecified joint, not elsewhere classified: Secondary | ICD-10-CM

## 2014-06-10 NOTE — Therapy (Signed)
[facility] [address] [place], [place], [address] Phone: [phone]   Fax:  [phone]  Occupational Therapy Treatment  Patient Details  Name: Jim Jones MRN: [id number] Date of Birth: 1960-11-08 Referring Provider:  Rowe Clack, MD  Encounter Date: 06/10/2014      OT End of Session - 06/10/14 1039    Visit Number 14   Date for OT Re-Evaluation 06/13/13   Authorization Type [employer] 40 visit limit combined   OT Start Time 0847   OT Stop Time 0925   OT Time Calculation (min) 38 min   Activity Tolerance Patient tolerated treatment well   Behavior During Therapy [facility] for tasks assessed/performed      Past Medical History  Diagnosis Date  . OA (osteoarthritis)   . Low back pain 2009    Hx right side ruptured disc  . CHICKENPOX, HX OF   . MIGRAINE HEADACHE   . RENAL CALCULUS, HX OF 2000    Past Surgical History  Procedure Laterality Date  . Olecranon bursa excision  09/28/11    right side, Ortmann    There were no vitals taken for this visit.  Visit Diagnosis:  Generalized weakness  Decreased range of motion  Wrist pain, acute, left      Subjective Assessment - 06/10/14 0903    Currently in Pain? Yes   Pain Score 3    Pain Location Wrist   Pain Orientation Left   Pain Descriptors / Indicators Aching   Pain Type Chronic pain   Pain Onset More than a month ago   Pain Frequency Intermittent   Aggravating Factors  pulling at work   Pain Relieving Factors heat, meds   Multiple Pain Sites No                 OT Treatments/Exercises (OP) - 06/10/14 0001    Exercises   Exercises Wrist   Wrist Exercises   Wrist Flexion Strengthening;20 reps   Bar Weights/Barbell (Wrist Flexion) 3 lbs   Wrist Extension Strengthening;20 reps   Bar Weights/Barbell (Wrist Extension) 3 lbs   Wrist Radial Deviation Strengthening;20 reps   Bar Weights/Barbell (Radial Deviation) 3 lbs   Wrist Ulnar  Deviation Strengthening;20 reps   Bar Weights/Barbell (Ulnar Deviation) 3 lbs   Other wrist exercises forearm supination/ pronation with hammer,   20 reps   Other wrist exercises velco rollers various sizes and shapes for resisted supination/ pronation   Hand Exercises   Other Hand Exercises Gripper set at 55 lbs to pick up 1 inch blocks, min difficulty   Other Hand Exercises Graded clothespins for sustained pinch  Pt alternating digits with pinch    LUE Fluidotherapy   Number Minutes Fluidotherapy 11 Minutes   LUE Fluidotherapy Location Wrist;Hand   Comments for stiffness and pain                  OT Short Term Goals - 06/03/14 0856    OT SHORT TERM GOAL #1   Title I with inital HEP   Status Achieved   OT SHORT TERM GOAL #2   Title Pt will increase L thumb MP flexion by 5 for increased functional use.   Status Achieved   OT SHORT TERM GOAL #3   Title Pt will increase LUE grip strength by 8 lbs for increased funcional use.   Status Achieved   OT SHORT TERM GOAL #4   Title Pt will report that LUE pain is 5/10  or less during functional activity.   Status Achieved  3/10 per pt           OT Long Term Goals - 05/15/14 4196    OT LONG TERM GOAL #1   Title I with updated HEP.   Baseline 06/13/14   Time 8   Period Weeks   Status On-going   OT LONG TERM GOAL #2   Title Pt will demonstrate LUE grip strength of at least 60 lbs for increased functional use.   Baseline 06/13/14   Time 8   Period Weeks   Status Revised  originally set for 45 lbs but met on 05/15/14   OT LONG TERM GOAL #3   Title Increase L tip pinch and lateral pinch by 8 lbs from inital measurement.   Baseline 06/13/14  (eval: Lt tip pinch = 8 lbs, Rt = 18 lbs. Lt lateral pinch = 9 lbs, Rt = 29 lbs)   Time 4   Period Weeks   Status On-going   OT LONG TERM GOAL #4   Title Pt will report that he is using LUE as non dominant assist at least 90% x for work and yard work activities with pain less than or  equal to 3/10.   Baseline 06/13/14   Time 4   Period Weeks   Status On-going               Plan - 06/10/14 1039    Clinical Impression Statement Pt is progressing well and agrees to d/c next visit   Rehab Potential Good   OT Frequency 2x / week   OT Duration 8 weeks   OT Treatment/Interventions Self-care/ADL training;Electrical Stimulation;Iontophoresis;Therapeutic exercise;Splinting;Neuromuscular education;Parrafin;Moist Heat;Fluidtherapy;Therapeutic exercises;Patient/family education;Therapeutic activities;Passive range of motion;Manual Therapy;DME and/or AE instruction;Ultrasound;Contrast Bath;Cryotherapy   Plan check goals anticipate d/c next visit   OT Home Exercise Plan green putty   Consulted and Agree with Plan of Care Patient        Problem List Patient Active Problem List   Diagnosis Date Noted  . Scaphoid fracture of wrist 12/23/2013  . DDD (degenerative disc disease), lumbar   . MIGRAINE HEADACHE 12/07/2009  . OSTEOARTHRITIS 12/07/2009  . RENAL CALCULUS, HX OF 12/07/2009    Khya Halls 06/10/2014, 10:45 AM Theone Murdoch, OTR/L [phone] Phone: [phone] 10:46 AM 06/10/2014 [facility] [address] [place], [place], [address] Phone: [phone]   Fax:  [phone]

## 2014-06-12 ENCOUNTER — Encounter: Payer: Self-pay | Admitting: Occupational Therapy

## 2014-06-12 ENCOUNTER — Ambulatory Visit: Payer: 59 | Admitting: Occupational Therapy

## 2014-06-12 DIAGNOSIS — M25532 Pain in left wrist: Secondary | ICD-10-CM

## 2014-06-12 DIAGNOSIS — R531 Weakness: Secondary | ICD-10-CM

## 2014-06-12 DIAGNOSIS — G8929 Other chronic pain: Secondary | ICD-10-CM

## 2014-06-12 NOTE — Therapy (Signed)
[facility] [address] [place], [place], [address] Phone: [phone]   Fax:  [phone]  Occupational Therapy Treatment  Patient Details  Name: Jim Jones MRN: [id number] Date of Birth: [birth year] Referring Provider:  Rowe Clack, MD  Encounter Date: 06/12/2014      OT End of Session - 06/12/14 0916    Visit Number 15   Number of Visits 17   Date for OT Re-Evaluation 06/13/13   Authorization Type [employer] 40 visit limit combined   OT Start Time 0850   OT Stop Time 0925   OT Time Calculation (min) 35 min      Past Medical History  Diagnosis Date  . OA (osteoarthritis)   . Low back pain 2009    Hx right side ruptured disc  . CHICKENPOX, HX OF   . MIGRAINE HEADACHE   . RENAL CALCULUS, HX OF 2000    Past Surgical History  Procedure Laterality Date  . Olecranon bursa excision  09/28/11    right side, [facility]mann    There were no vitals taken for this visit.  Visit Diagnosis:  Generalized weakness  Wrist pain, chronic, left      Subjective Assessment - 06/12/14 0857    Symptoms "I'm using it more at work. I can only feel it when I push/pull really hard"   Currently in Pain? Yes   Pain Score 2    Pain Location Wrist   Pain Orientation Left   Pain Type Chronic pain   Pain Onset More than a month ago   Pain Frequency Intermittent                 OT Treatments/Exercises (OP) - 06/12/14 0914    ADLs   ADL Comments Assessed and reviewed LTG's and progress to date with patient.    LUE Fluidotherapy   Number Minutes Fluidotherapy 12 Minutes   LUE Fluidotherapy Location Hand;Wrist   Comments to decrease stiffness/pain     Pt also instructed in proper use of heat/cold packs and recommended hot pack if joint feels stiff/cold, but ice after work if joint feels inflammed. Pt instructed in no more than 10-15 minutes at a time. Also recommended pt continue to use red putty for tip and  lateral pinch, but ok to use green putty for mass grasp. Pt agreed.             OT Short Term Goals - 06/03/14 0856    OT SHORT TERM GOAL #1   Title I with inital HEP   Status Achieved   OT SHORT TERM GOAL #2   Title Pt will increase L thumb MP flexion by 5 for increased functional use.   Status Achieved   OT SHORT TERM GOAL #3   Title Pt will increase LUE grip strength by 8 lbs for increased funcional use.   Status Achieved   OT SHORT TERM GOAL #4   Title Pt will report that LUE pain is 5/10 or less during functional activity.   Status Achieved  3/10 per pt           OT Long Term Goals - 06/12/14 0859    OT LONG TERM GOAL #1   Title I with updated HEP.   Status Achieved   OT LONG TERM GOAL #2   Title Pt will demonstrate LUE grip strength of at least 60 lbs for increased functional use.   Status Achieved  Lt = 64 lbs  (Rt =  100 lbs)   OT LONG TERM GOAL #3   Title Increase L tip pinch and lateral pinch by 8 lbs from inital measurement.   Baseline 06/13/14  (eval: Lt tip pinch = 8 lbs, Rt = 18 lbs. Lt lateral pinch = 9 lbs, Rt = 29 lbs)   Status Not Met  tip pinch = 11 lbs, lateral pinch = 14 lbs   OT LONG TERM GOAL #4   Title Pt will report that he is using LUE as non dominant assist at least 90% x for work and yard work activities with pain less than or equal to 3/10.   Status Achieved               Plan - 06/12/14 0917    Clinical Impression Statement Pt met 3/4 LTG's. LTG #3 not met - tip and lateral pinch have improved since initial evaluation, however not by 8 lbs. Pt w/ overall decreased pain and tolerating all HEP's   Plan D/C O.T.         Problem List Patient Active Problem List   Diagnosis Date Noted  . Scaphoid fracture of wrist 12/23/2013  . DDD (degenerative disc disease), lumbar   . MIGRAINE HEADACHE 12/07/2009  . OSTEOARTHRITIS 12/07/2009  . RENAL CALCULUS, HX OF 12/07/2009     OCCUPATIONAL THERAPY DISCHARGE SUMMARY  Visits  from Start of Care: 15  Current functional level related to goals / functional outcomes: SEE ABOVE - Pt met all STG's and 3/4 LTG's   Remaining deficits: Mild pain Lt wrist Strength Lt hand/wrist   Education / Equipment: HEP's for ROM and strengthening  Plan: Patient agrees to discharge.  Patient goals were met. Patient is being discharged due to being pleased with the current functional level.  ?????         Carey Bullocks, OTR/L 06/12/2014, 9:20 AM  [facility] [address] [place], [place], [address] Phone: [phone]   Fax:  [phone]

## 2014-06-16 ENCOUNTER — Ambulatory Visit: Payer: Self-pay | Admitting: Internal Medicine

## 2014-06-18 ENCOUNTER — Ambulatory Visit: Payer: Self-pay | Admitting: Internal Medicine

## 2014-06-19 ENCOUNTER — Ambulatory Visit: Payer: Self-pay | Admitting: Internal Medicine

## 2014-06-20 ENCOUNTER — Ambulatory Visit: Payer: Self-pay | Admitting: Family Medicine

## 2014-06-23 ENCOUNTER — Ambulatory Visit (INDEPENDENT_AMBULATORY_CARE_PROVIDER_SITE_OTHER)
Admission: RE | Admit: 2014-06-23 | Discharge: 2014-06-23 | Disposition: A | Discharge status: Home / Self Care | Payer: 59 | Source: Ambulatory Visit | Attending: Family Medicine | Admitting: Family Medicine

## 2014-06-23 ENCOUNTER — Other Ambulatory Visit (INDEPENDENT_AMBULATORY_CARE_PROVIDER_SITE_OTHER): Payer: 59

## 2014-06-23 ENCOUNTER — Ambulatory Visit (INDEPENDENT_AMBULATORY_CARE_PROVIDER_SITE_OTHER): Payer: 59 | Admitting: Family Medicine

## 2014-06-23 ENCOUNTER — Encounter: Payer: Self-pay | Admitting: Family Medicine

## 2014-06-23 VITALS — BP 116/72 | HR 72 | Ht 70.0 in | Wt 180.0 lb

## 2014-06-23 DIAGNOSIS — S62002A Unspecified fracture of navicular [scaphoid] bone of left wrist, initial encounter for closed fracture: Secondary | ICD-10-CM

## 2014-06-23 NOTE — Patient Instructions (Signed)
You are doing well overall.  Ice at the end of the day Continue the vitaminD Wear brace at night for 2 weeks.  During work try the ace wrap See me again in 3 weeks and should be good.

## 2014-06-23 NOTE — Assessment & Plan Note (Signed)
Patient has done significant better with formal physical therapy at this time and occupational therapy. Patient continues to have some mild weakness. Patient does not have any wasting of the thenar eminence but I will to make sure that there is no avascular necrosis of the scaphoid bone. X-rays were ordered today. We discussed bracing still at night for the next 2 weeks as well as a icing regimen. Patient will continue home exercises and increase his vitamin D. Patient will come back and see me again in 3 weeks to make sure he is doing well.

## 2014-06-23 NOTE — Progress Notes (Signed)
  Tawana ScaleZach Samaa Ueda D.O. [facility] [address] [place], [place] [address] Phone: [phone] Subjective:      CC: Wrist pain followup  LKG:[id number]:Subjective Jim LauberJohnny Jones Jakob is a 54 y.o. male coming in with complaint of left wrist pain. Patient was seen previously and did have a scaphoid fracture that was occult. This is not showed upon x-ray but has showed up on ultrasound. Patient did go to formal physical therapy and did get significantly better. Patient states though that with any type of rotation he still has a severe pain over the base of his thumb. Patient states that he has noticed that the strength has improved. Denies any radiation up his arm. States that it seems to be localized. Can do daily activities without any pain. Patient though is concerned because this is his livelihood and has some difficulty with his job.     Past medical history, social, surgical and family history all reviewed in electronic medical record.   Review of Systems: No headache, visual changes, nausea, vomiting, diarrhea, constipation, dizziness, abdominal pain, skin rash, fevers, chills, night sweats, weight loss, swollen lymph nodes, body aches, joint swelling, muscle aches, chest pain, shortness of breath, mood changes.   Objective Blood pressure 116/72, pulse 72, height 5\' 10"  (1.778 m), weight 180 lb (81.647 kg), SpO2 99 %.  General: No apparent distress alert and oriented x3 mood and affect normal, dressed appropriately.  HEENT: Pupils equal, extraocular movements intact  Respiratory: Patient'Jones speak in full sentences and does not appear short of breath  Cardiovascular: No lower extremity edema, non tender, no erythema  Skin: Warm dry intact with no signs of infection or rash on extremities or on axial skeleton.  Abdomen: Soft nontender  Neuro: Cranial nerves II through XII are intact, neurovascularly intact in all extremities with 2+ DTRs and 2+ pulses.  Lymph: No lymphadenopathy of posterior  or anterior cervical chain or axillae bilaterally.  Gait normal with good balance and coordination.  MSK:  Non tender with full range of motion and good stability and symmetric strength and tone of shoulders, elbows, hip, knee and ankles bilaterally.  Wrist: Left Inspection normal with no visible erythema or swelling. ROM smooth and normal with good flexion and extension and ulnar/radial deviation that is symmetrical with opposite wrist. Patient does have some pain with radial deviation over the scaphoid area. Palpation is normal over metacarpals, navicular, Discomfort over the scaphoid bone,   Mildly tender over the snuff box No tenderness over Canal of Guyon. Strength 5/5 in all directions without pain. Negative Finkelstein, tinel'Jones and phalens. Negative Watson'Jones test. Contralateral wrist unremarkable  MSK US performed of: Left wrist This study was ordered, performed, and interpreted by Terrilee FilesZach Harlow Carrizales D.O.  Wrist: All extensor compartments visualized and tendons all normal in appearance without fraying, tears, or sheath effusions. No effusion seen. TFCC appears to be intact Scapholunate ligament intact. Scaphoid bone does seem to be healed. Callus formation still noted. This would be considered delayed healing at this time. Carpal tunnel visualized and median nerve area normal, flexor tendons all normal in appearance without fraying, tears, or sheath effusions. Power doppler signal normal.  IMPRESSION:  Scaphoid fracture healing but would be considered delayed at this time.     Impression and Recommendations:     This case required medical decision making of moderate complexity.

## 2014-07-16 ENCOUNTER — Ambulatory Visit (INDEPENDENT_AMBULATORY_CARE_PROVIDER_SITE_OTHER): Payer: 59 | Admitting: Family Medicine

## 2014-07-16 ENCOUNTER — Encounter: Payer: Self-pay | Admitting: Family Medicine

## 2014-07-16 VITALS — BP 110/82 | HR 84 | Ht 70.0 in | Wt 174.0 lb

## 2014-07-16 DIAGNOSIS — S62002A Unspecified fracture of navicular [scaphoid] bone of left wrist, initial encounter for closed fracture: Secondary | ICD-10-CM

## 2014-07-16 NOTE — Progress Notes (Signed)
Pre visit review using our clinic review tool, if applicable. No additional management support is needed unless otherwise documented below in the visit note. 

## 2014-07-16 NOTE — Patient Instructions (Signed)
Good to see you Ice is your friend Continue the range of motion You are good to do what you want really at this time See me when you need me.

## 2014-07-16 NOTE — Assessment & Plan Note (Signed)
Healed at this time. We discussed icing and home exercises as well as continuing to increase his strength. Patient will follow-up with me on an as-needed basis.

## 2014-07-16 NOTE — Progress Notes (Signed)
  Jim ScaleZach Cecia Jones D.O. [facility] [address] [place], [place] [address] Phone: [phone] Subjective:      CC: Wrist pain followup  ZHY:[id number]:Subjective Jim LauberJohnny S Jones is a 54 y.o. male coming in with complaint of left wrist pain. Patient was seen previously and did have a scaphoid fracture that was occult. This is not showed upon x-ray but has showed up on ultrasound. Patient did go to formal physical therapy and did get significantly better. Patient still had some mild delayed healing of the scaphoid bone at last visit. Patient was encouraged to start increasing her activity slowly. Patient states he is feeling significantly better. Patient has returned to work full-time. Has some mild discomfort at night but otherwise doing really well. Patient is not taking any pain medication or any ibuprofen. 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     Past medical history, social, surgical and family history all reviewed in electronic medical record.   Review of Systems: No headache, visual changes, nausea, vomiting, diarrhea, constipation, dizziness, abdominal pain, skin rash, fevers, chills, night sweats, weight loss, swollen lymph nodes, body aches, joint swelling, muscle aches, chest pain, shortness of breath, mood changes.   Objective Blood pressure 110/82, pulse 84, height 5\' 10"  (1.778 m), weight 174 lb (78.926 kg), SpO2 99 %.  General: No apparent distress alert and oriented x3 mood and affect normal, dressed appropriately.  HEENT: Pupils equal, extraocular movements intact  Respiratory: Patient's speak in full sentences and does not appear short of breath  Cardiovascular: No lower extremity edema, non tender, no erythema  Skin: Warm dry intact with no signs of infection or rash on extremities or on axial skeleton.  Abdomen: Soft nontender  Neuro: Cranial nerves II through XII are intact,  neurovascularly intact in all extremities with 2+ DTRs and 2+ pulses.  Lymph: No lymphadenopathy of posterior or anterior cervical chain or axillae bilaterally.  Gait normal with good balance and coordination.  MSK:  Non tender with full range of motion and good stability and symmetric strength and tone of shoulders, elbows, hip, knee and ankles bilaterally.  Wrist: Left Inspection normal with no visible erythema or swelling. ROM smooth and normal with good flexion and extension and ulnar/radial deviation that is symmetrical with opposite wrist. Non-tender today which is an improvement Palpation is normal over metacarpals, navicular, scaphoid No tenderness over Canal of Guyon. Strength 5/5 in all directions without pain. Negative Finkelstein, tinel's and phalens. Negative Watson's test. Contralateral wrist unremarkable  MSK US performed of: Left wrist This study was ordered, performed, and interpreted by Jim Jones D.O.  Wrist: All extensor compartments visualized and tendons all normal in appearance without fraying, tears, or sheath effusions. No effusion seen. TFCC appears to be intact Scapholunate ligament intact. Scaphoid bone appears normal at this time with very small amount of reabsorption still remaining. No defect noted Power doppler signal normal.  IMPRESSION:  Scaphoid fracture healed    Impression and Recommendations:     This case required medical decision making of moderate complexity.

## 2015-03-18 IMAGING — CR DG WRIST COMPLETE 3+V*L*
2 series · 2 of 2 positions shown · non-contrast
Comparison: None.

CLINICAL DATA: History of scaphoid fracture, followup

EXAM:
LEFT WRIST - COMPLETE 3+ VIEW

[view not recorded (1 of 2)]
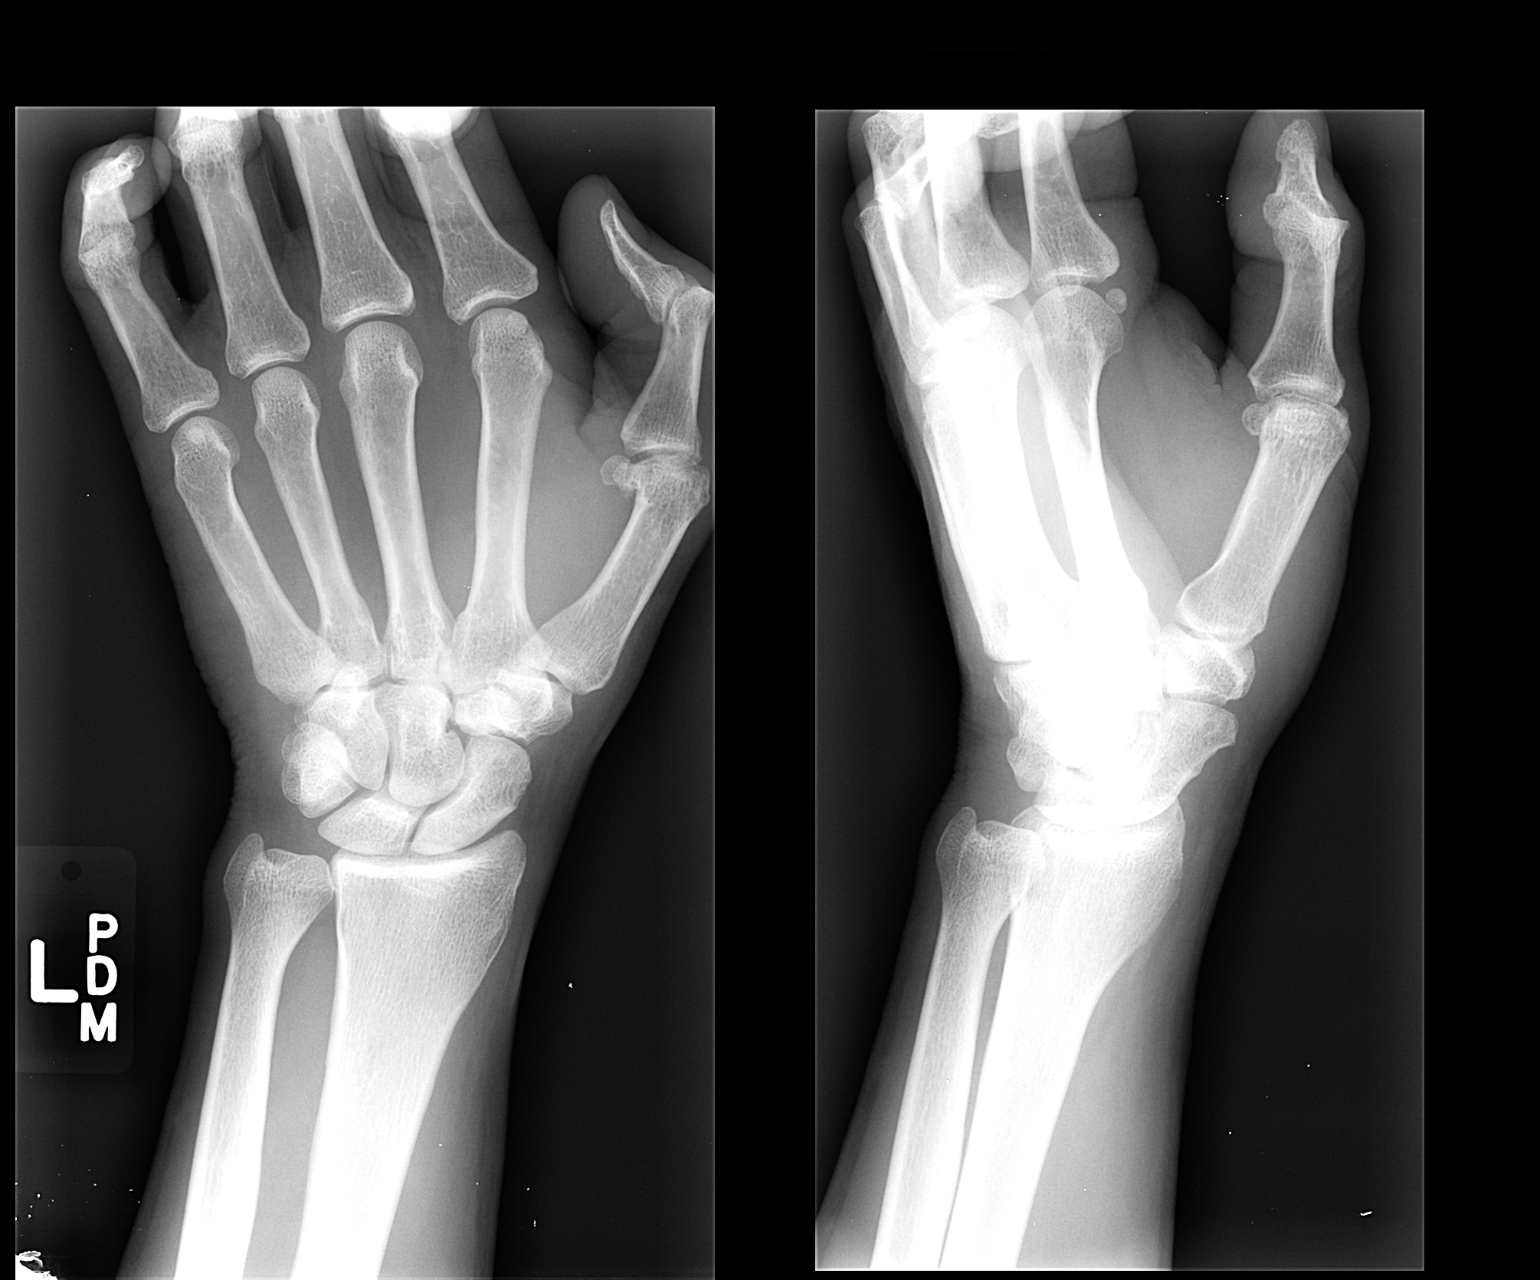

[view not recorded (2 of 2)]
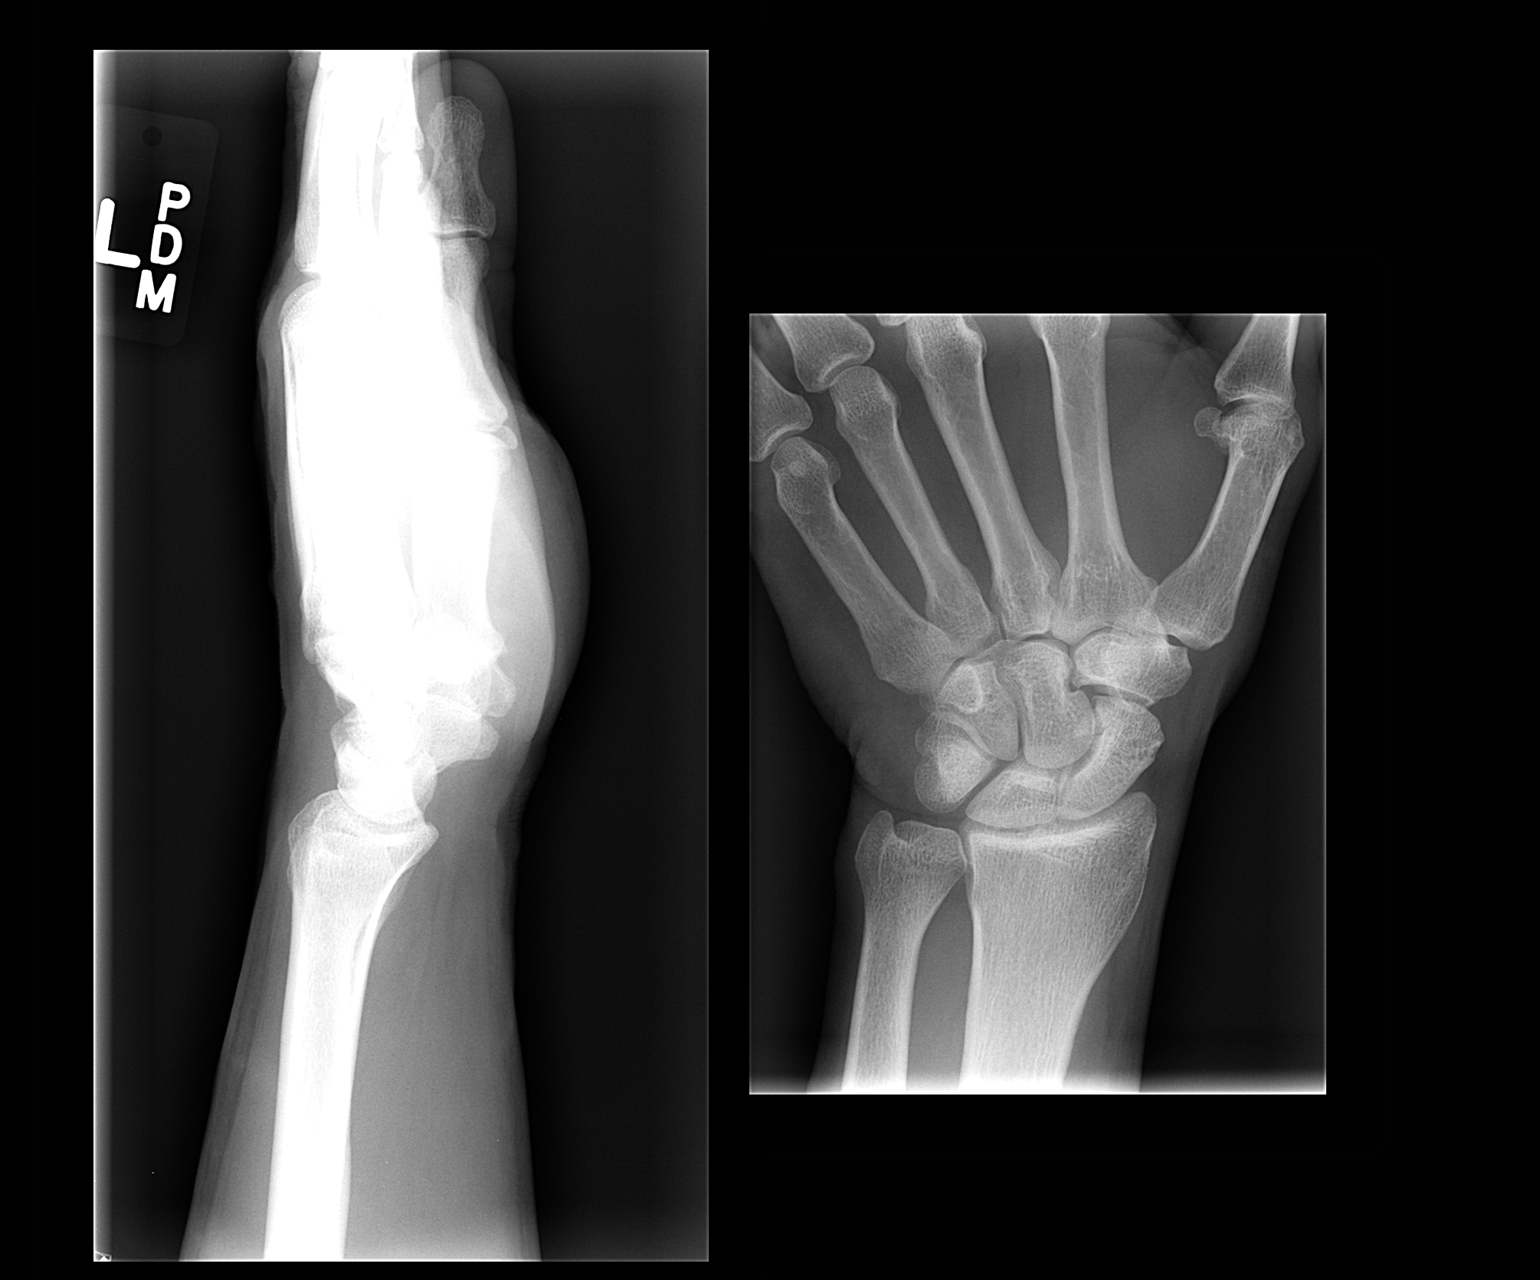

[2 of 2 positions shown; findings below may reference images not displayed]

FINDINGS: No definite fracture is seen. On the scaphoid view there is slight
irregularity of the trabecular pattern of the mid distal aspect of
the navicula but no definite fracture is seen. The radiocarpal joint
space appears normal and intercarpal joint spaces are normal.
Alignment is normal.
IMPRESSION: Negative.
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
## 2015-08-16 ENCOUNTER — Emergency Department (HOSPITAL_COMMUNITY)
Admission: EM | Admit: 2015-08-16 | Discharge: 2015-08-16 | Disposition: A | Discharge status: Home / Self Care | Payer: 59 | Source: Home / Self Care | Attending: Emergency Medicine | Admitting: Emergency Medicine

## 2015-08-16 ENCOUNTER — Encounter (HOSPITAL_COMMUNITY): Payer: Self-pay | Admitting: Nurse Practitioner

## 2015-08-16 DIAGNOSIS — L239 Allergic contact dermatitis, unspecified cause: Secondary | ICD-10-CM

## 2015-08-16 MED ORDER — PREDNISONE 10 MG (21) PO TBPK: ORAL_TABLET | ORAL | Status: AC

## 2015-08-16 NOTE — ED Provider Notes (Signed)
HPI  SUBJECTIVE:  Jim Jones is a 55 y.o. male who presents with facial erythema, irritation, facial swelling,, lip swelling "blisters" starting yesterday after putting some dye in his beard. Patient states that  he had to shave his beard. He denies rash elsewhere. No tongue swelling, shortness of breath, voice changes, wheezing, abdominal, diarrhea. No crusting. He tried turpentine, Zyrtec, ice. Symptoms are better with the ice pack. No aggravating factors. Similar symptoms before when he use the exact same dye. Past medical history negative for allergy, anaphylaxis, diabetes, hypertension. PMD Dr. Wylene Simmerisovec    Past Medical History  Diagnosis Date  . OA (osteoarthritis)   . Low back pain 2009    Hx right side ruptured disc  . CHICKENPOX, HX OF   . MIGRAINE HEADACHE   . RENAL CALCULUS, HX OF 2000    Past Surgical History  Procedure Laterality Date  . Olecranon bursa excision  09/28/11    right side, Ortmann    Family History  Problem Relation Age of Onset  . Diabetes Mother   . Hypertension Mother   . Prostate cancer Father   . Lung cancer Sister   . Colon cancer Neg Hx     Social History  Substance Use Topics  . Smoking status: Never Smoker   . Smokeless tobacco: Never Used  . Alcohol Use: 0.0 oz/week    0 Standard drinks or equivalent per week     Comment: Occassional    No current facility-administered medications for this encounter.  Current outpatient prescriptions:  .  predniSONE (STERAPRED UNI-PAK 21 TAB) 10 MG (21) TBPK tablet, Dispense one 6 day pack. Take as directed with food., Disp: 21 tablet, Rfl: 0 .  [DISCONTINUED] Glucosamine-Chondroitin (GLUCOSAMINE CHONDR COMPLEX PO), Take by mouth 2 (two) times a week.  , Disp: , Rfl:  .  [DISCONTINUED] Multiple Vitamins-Minerals (MENS MULTIVITAMIN PLUS) TABS, Take by mouth daily.  , Disp: , Rfl:  .  [DISCONTINUED] naproxen sodium (ALEVE) 220 MG tablet, Take 1 tablet (220 mg total) by mouth 2 (two) times daily  with a meal. X 1 week, then as needed for pain, Disp: , Rfl:  .  [DISCONTINUED] Omega-3 Fatty Acids (FISH OIL) 1000 MG CAPS, Take by mouth 2 (two) times daily.  , Disp: , Rfl:   No Known Allergies   ROS  As noted in HPI.   Physical Exam  BP 145/89 mmHg  Pulse 79  Temp(Src) 99 F (37.2 C) (Oral)  Resp 18  SpO2 99%  Constitutional: Well developed, well nourished, no acute distress Eyes:  EOMI, conjunctiva normal bilaterally HENT: Normocephalic, atraumatic,mucus membranes moist. Normal voice, airway widely patent. Respiratory: Normal inspiratory effort Cardiovascular: Normal rate GI: nondistended skin: Facial erythema, edema where beard was, no appreciable blisters, crusting. Skin intact. Mild swelling of the lips Musculoskeletal: no deformities Neurologic: Alert & oriented x 3, no focal neuro deficits Psychiatric: Speech and behavior appropriate   ED Course   Medications - No data to display  No orders of the defined types were placed in this encounter.    No results found for this or any previous visit (from the past 24 hour(s)). No results found.  ED Clinical Impression  Allergic contact dermatitis   ED Assessment/Plan  Presentation most consistent with allergic contact dermatitis. There are no signs of infection or airway compromise at this time. Home with 6 day prednisone taper. continue Zyrtec, Benadryl 50 mg at night. Follow up with primary care physician as needed, discussed signs and symptoms  that should prompt return to the ER. Patient agrees with plan  *This clinic note was created using Dragon dictation software. Therefore, there may be occasional mistakes despite careful proofreading.  ?    Domenick Gong, MD 08/16/15 1447

## 2015-08-16 NOTE — ED Notes (Addendum)
Pt reports 2 day history red, itchy rash and swelling around beard area after applying hair dye. He also reports recent bath in turpentine, unsure if that may have irritated his symptoms more. He denies any trouble swallowing or breathing. He tried zyrtec and ice packs with no relief

## 2015-08-16 NOTE — Discharge Instructions (Signed)
Continue Zyrtec, 50 mg of Benadryl at night, ice, anti-inflammatory such as ibuprofen. Make sure you finish all the prednisone.

## 2016-06-19 ENCOUNTER — Encounter: Payer: Self-pay | Admitting: Student

## 2017-03-27 DIAGNOSIS — Z0000 Encounter for general adult medical examination without abnormal findings: Secondary | ICD-10-CM | POA: Diagnosis not present

## 2017-03-27 DIAGNOSIS — Z125 Encounter for screening for malignant neoplasm of prostate: Secondary | ICD-10-CM | POA: Diagnosis not present

## 2017-03-27 DIAGNOSIS — R82998 Other abnormal findings in urine: Secondary | ICD-10-CM | POA: Diagnosis not present

## 2017-04-03 DIAGNOSIS — M545 Low back pain: Secondary | ICD-10-CM | POA: Diagnosis not present

## 2017-04-03 DIAGNOSIS — Z0000 Encounter for general adult medical examination without abnormal findings: Secondary | ICD-10-CM | POA: Diagnosis not present

## 2017-04-03 DIAGNOSIS — Z23 Encounter for immunization: Secondary | ICD-10-CM | POA: Diagnosis not present

## 2017-04-03 DIAGNOSIS — Z800 Family history of malignant neoplasm of digestive organs: Secondary | ICD-10-CM | POA: Diagnosis not present

## 2017-09-12 DIAGNOSIS — Z1389 Encounter for screening for other disorder: Secondary | ICD-10-CM | POA: Diagnosis not present

## 2017-09-12 DIAGNOSIS — M545 Low back pain: Secondary | ICD-10-CM | POA: Diagnosis not present

## 2018-03-07 DIAGNOSIS — Z23 Encounter for immunization: Secondary | ICD-10-CM | POA: Diagnosis not present

## 2018-04-04 DIAGNOSIS — Z0000 Encounter for general adult medical examination without abnormal findings: Secondary | ICD-10-CM | POA: Diagnosis not present

## 2018-04-04 DIAGNOSIS — Z125 Encounter for screening for malignant neoplasm of prostate: Secondary | ICD-10-CM | POA: Diagnosis not present

## 2018-04-04 DIAGNOSIS — R82998 Other abnormal findings in urine: Secondary | ICD-10-CM | POA: Diagnosis not present

## 2018-04-10 DIAGNOSIS — M25522 Pain in left elbow: Secondary | ICD-10-CM | POA: Diagnosis not present

## 2018-04-10 DIAGNOSIS — Z800 Family history of malignant neoplasm of digestive organs: Secondary | ICD-10-CM | POA: Diagnosis not present

## 2018-04-10 DIAGNOSIS — Z125 Encounter for screening for malignant neoplasm of prostate: Secondary | ICD-10-CM | POA: Diagnosis not present

## 2018-04-10 DIAGNOSIS — Z0000 Encounter for general adult medical examination without abnormal findings: Secondary | ICD-10-CM | POA: Diagnosis not present

## 2018-04-10 DIAGNOSIS — E7800 Pure hypercholesterolemia, unspecified: Secondary | ICD-10-CM | POA: Diagnosis not present

## 2018-04-30 DIAGNOSIS — S80862A Insect bite (nonvenomous), left lower leg, initial encounter: Secondary | ICD-10-CM | POA: Diagnosis not present

## 2018-04-30 DIAGNOSIS — L500 Allergic urticaria: Secondary | ICD-10-CM | POA: Diagnosis not present
